# Patient Record
Sex: Female | Born: 2000 | Race: Black or African American | Hispanic: No | Marital: Single | Smoking: Never smoker
Health system: Southern US, Community
[De-identification: ages and names within clinical notes are randomized; demographics above are authoritative.]

## PROBLEM LIST (undated history)

## (undated) DIAGNOSIS — L309 Dermatitis, unspecified: Secondary | ICD-10-CM

## (undated) DIAGNOSIS — E669 Obesity, unspecified: Secondary | ICD-10-CM

## (undated) DIAGNOSIS — J302 Other seasonal allergic rhinitis: Secondary | ICD-10-CM

## (undated) DIAGNOSIS — Z87898 Personal history of other specified conditions: Secondary | ICD-10-CM

## (undated) HISTORY — DX: Dermatitis, unspecified: L30.9

## (undated) HISTORY — DX: Other seasonal allergic rhinitis: J30.2

## (undated) HISTORY — DX: Personal history of other specified conditions: Z87.898

## (undated) HISTORY — DX: Obesity, unspecified: E66.9

---
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
## 2020-01-21 IMAGING — US US ABDOMEN COMPLETE
1 series · 13 of 25 positions shown · non-contrast
Comparison: None.

CLINICAL DATA: Chronic abdominal pain

EXAM:
ABDOMEN ULTRASOUND COMPLETE

[Series 1: us abdomen complete · 0.22mm/px · 13 of 83 slices shown]
[im 1/83]
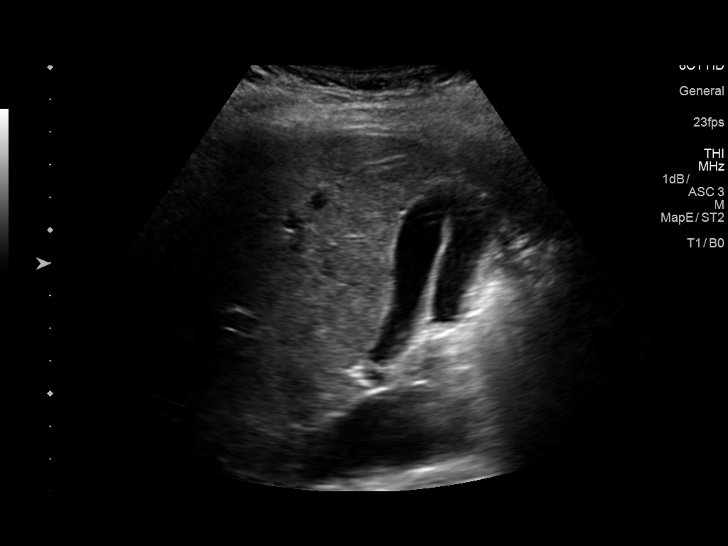
[im 7/83]
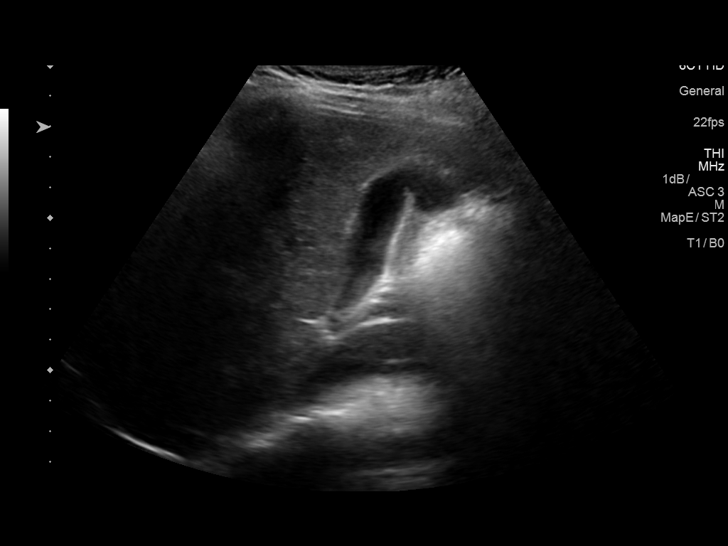
[im 14/83]
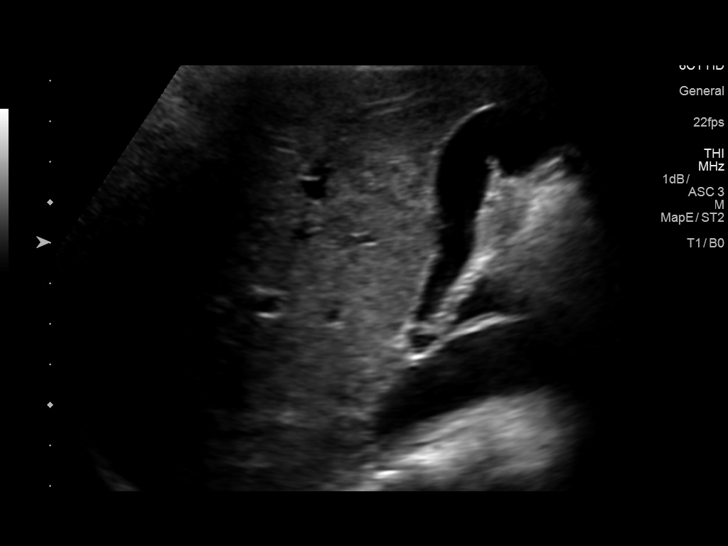
[im 21/83]
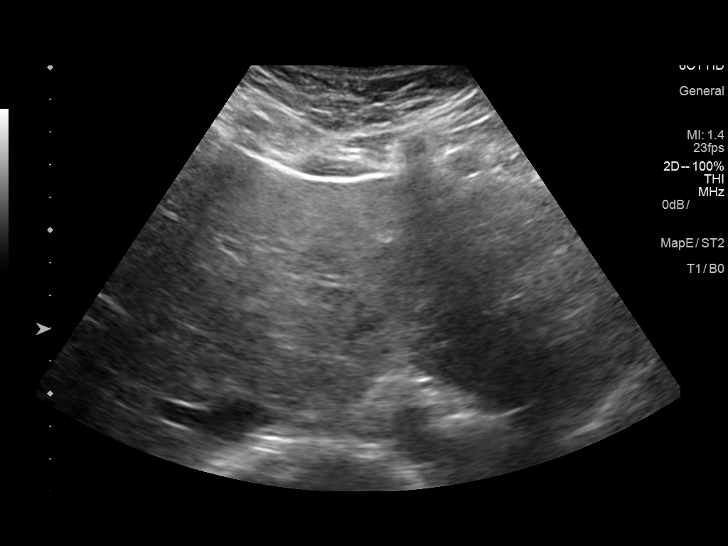
[im 28/83]
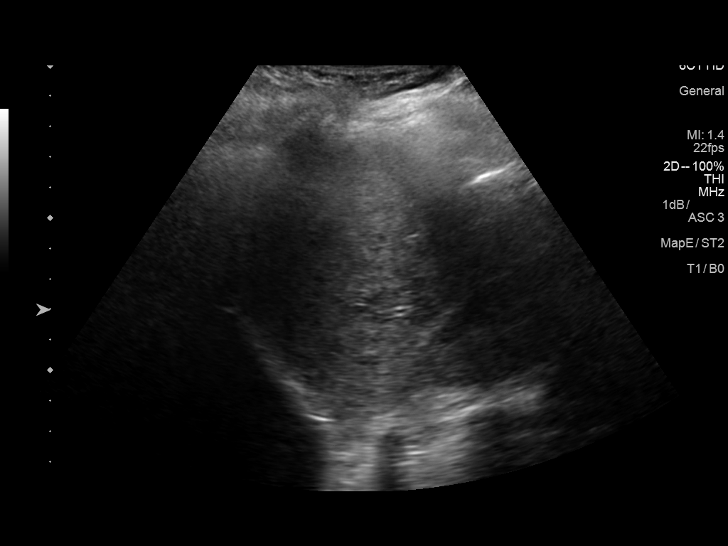
[im 35/83]
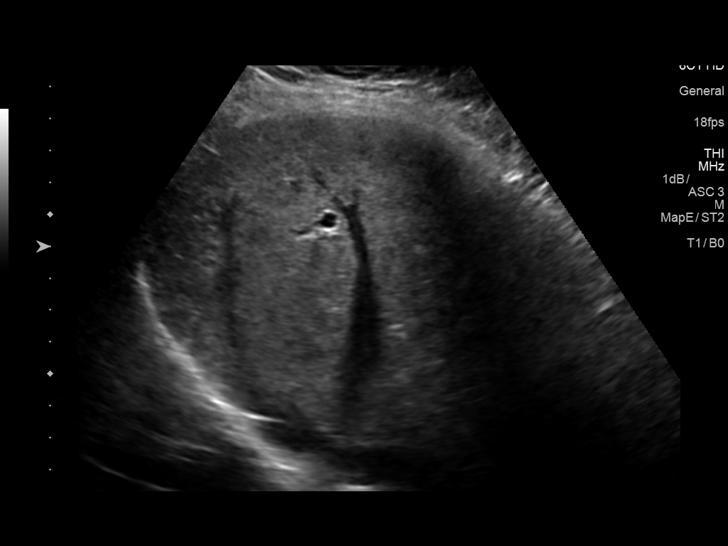
[im 42/83]
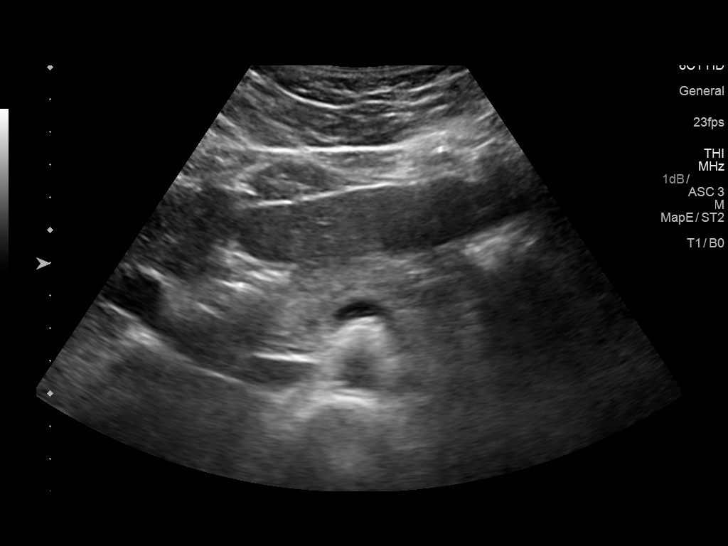
[im 48/83]
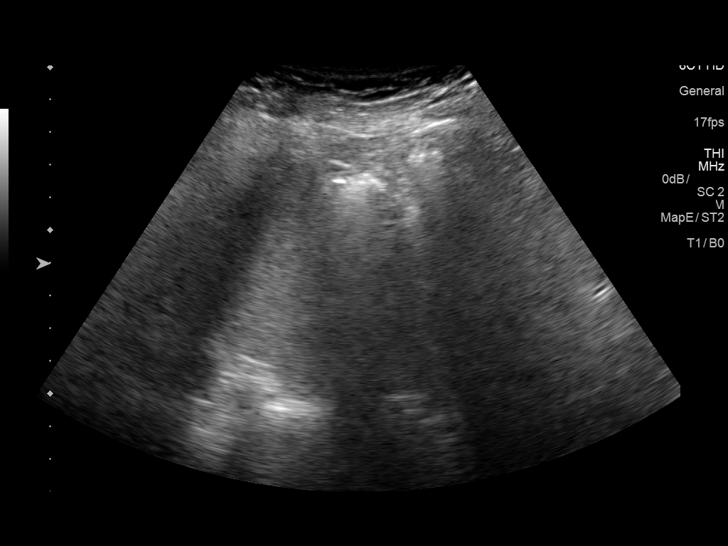
[im 55/83]
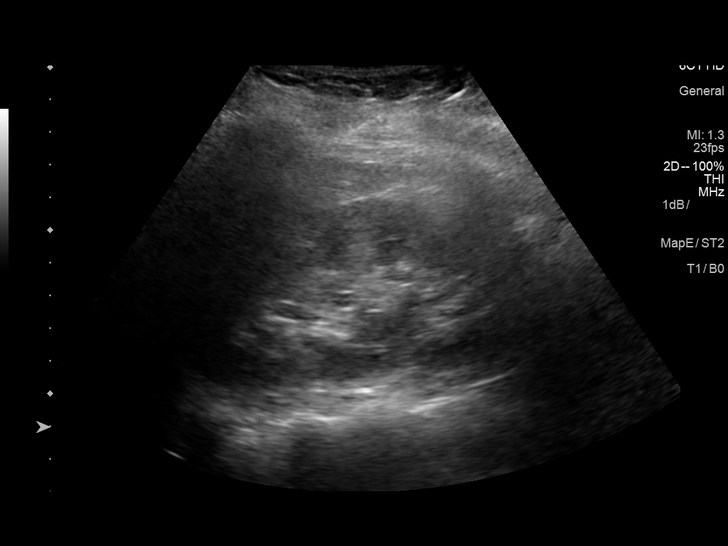
[im 62/83]
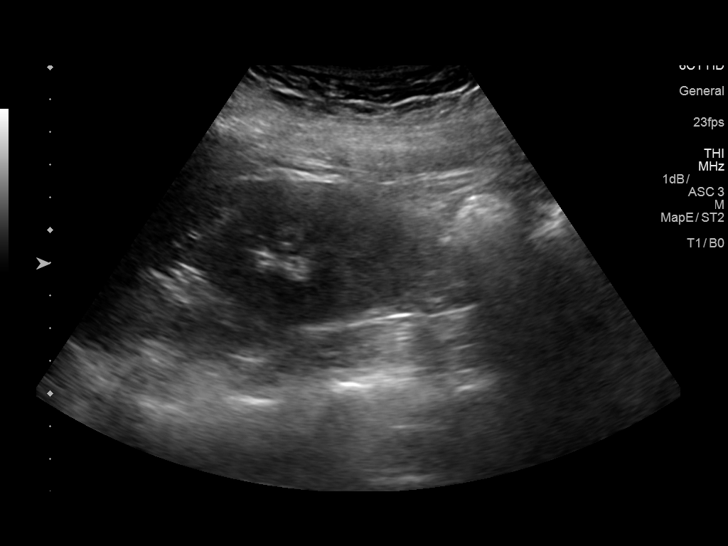
[im 69/83]
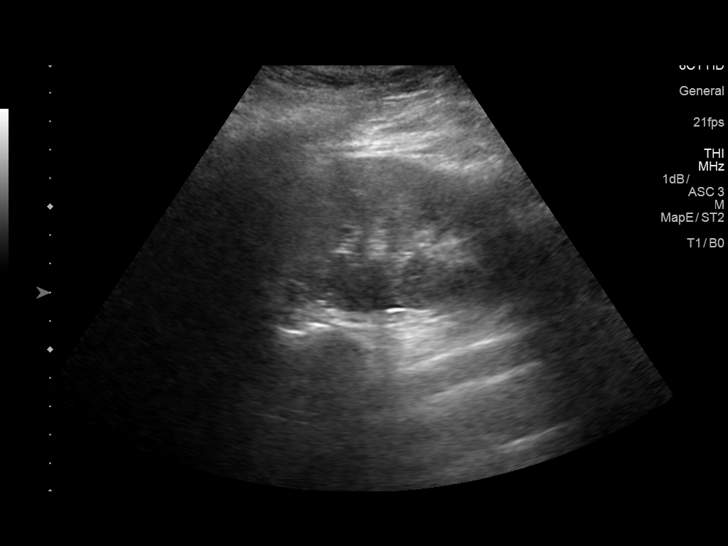
[im 76/83]
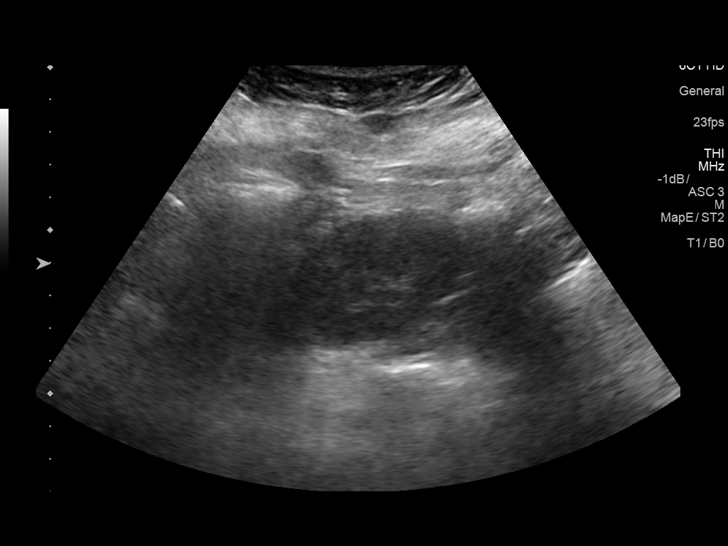
[im 83/83]
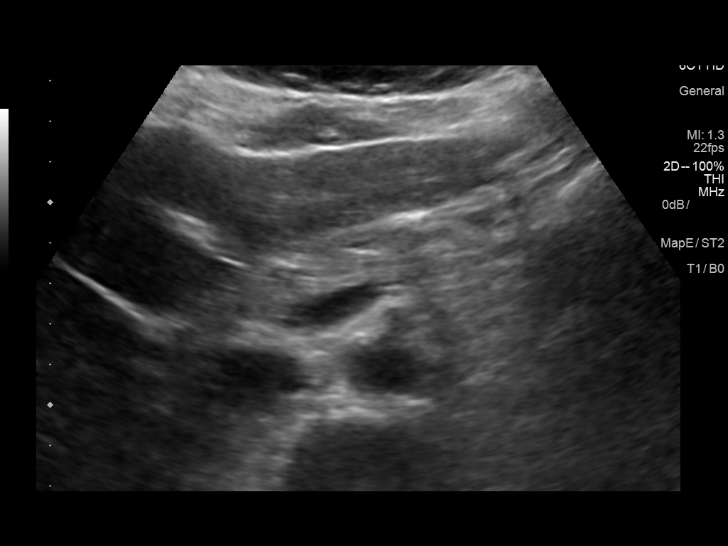

[13 of 25 positions shown; findings below may reference images not displayed]

FINDINGS: Gallbladder: No gallstones or wall thickening visualized. There is
no pericholecystic fluid. No sonographic Murphy sign noted by
sonographer.

Common bile duct: Diameter: 3 mm. No visualized intrahepatic, common
hepatic, or common bile duct dilatation.

Liver: No focal lesion identified. Liver echogenicity is mildly
inhomogeneous and overall somewhat increased. Portal vein is patent
on color Doppler imaging with normal direction of blood flow towards
the liver.

IVC: No abnormality visualized.

Pancreas: Visualized portion unremarkable. Portions of pancreas
obscured by gas.

Spleen: Size and appearance within normal limits.

Right Kidney: Length: 11.7 cm. Echogenicity within normal limits. No
mass or hydronephrosis visualized.

Left Kidney: Length: 11.1 cm. Echogenicity within normal limits. No
mass or hydronephrosis visualized.

Abdominal aorta: No aneurysm visualized.

Other findings: No appreciable ascites.
IMPRESSION: 1. The liver echogenicity is somewhat inhomogeneous and overall
increased. Suspect hepatic steatosis. While no focal liver lesions
are evident, it must be cautioned that the sensitivity of ultrasound
for detection of focal liver lesions is diminished in this
circumstance.

2. Portions of pancreas obscured by gas. Visualized portions of
pancreas appear normal.

3.  Study otherwise unremarkable.

## 2020-01-30 ENCOUNTER — Ambulatory Visit (HOSPITAL_COMMUNITY)
Admission: EM | Admit: 2020-01-30 | Discharge: 2020-01-30 | Disposition: A | Discharge status: Home / Self Care | Payer: BC Managed Care – PPO | Attending: Family Medicine | Admitting: Family Medicine

## 2020-01-30 ENCOUNTER — Other Ambulatory Visit: Payer: Self-pay

## 2020-01-30 ENCOUNTER — Encounter (HOSPITAL_COMMUNITY): Payer: Self-pay | Admitting: Family Medicine

## 2020-01-30 DIAGNOSIS — R1013 Epigastric pain: Secondary | ICD-10-CM | POA: Diagnosis not present

## 2020-01-30 MED ORDER — HYOSCYAMINE SULFATE SL 0.125 MG SL SUBL: 1.0000 | SUBLINGUAL_TABLET | Freq: Every evening | SUBLINGUAL | 1 refills | Status: DC | PRN

## 2020-01-30 NOTE — ED Triage Notes (Signed)
C/O sharp epigastric pains with nausea since this AM - has been having stomach trouble and recently had endoscopy.  States movement makes pain worse.  Took omeprazole.

## 2020-01-30 NOTE — ED Provider Notes (Signed)
[facility]    CSN: [id number] Arrival date & time: 01/30/20  1224      History   Chief Complaint Chief Complaint  Patient presents with  . Abdominal Pain    HPI Kimberly Henderson is a 19 y.o. female.   This is an established [facility] patient who presents with abdominal pain.  She was seen last October with a urinary tract infection.  She has had gastroenterology evaluation which included a negative endoscopy.  She is put on omeprazole anyway and this is not helped her.  Patient describes intermittent 9 out of 10 in severity pain in the epigastrium that occurs primarily in the morning, never awakens her at night, is not associated with nausea vomiting but is associated with some loose stools.  She is also taking hydroxyzine, and was told by her allergy specialist that this might cause memory loss.  Patient works at [employer].  She is recently started exercising more and is trying to stick to a diet.     Past Medical History:  Diagnosis Date  . Asthma   . Eczema    type rash  . History of angioedema   . Obesity   . Seasonal allergies    singular as needed; worse in cold weather  . Twin birth, mate liveborn     Patient Active Problem List   Diagnosis Date Noted  . Oligomenorrhea 09/04/2016  . Wears glasses 11/22/2015  . Prolonged periods 11/22/2015  . Well adolescent visit 07/29/2014  . Dysmenorrhea 07/29/2014  . Facial rash 01/05/2014  . BMI, pediatric > 99% for age 67/22/2014  . Prediabetes 01/31/2012  . Dyspepsia 01/31/2012  . Exercise-induced asthma 09/24/2011  . Acquired acanthosis nigricans 09/24/2011    History reviewed. No pertinent surgical history.  OB History   No obstetric history on file.      Home Medications    Prior to Admission medications   Medication Sig Start Date End Date Taking? Authorizing Provider  nortriptyline (PAMELOR) 25 MG capsule Take 1 capsule (25 mg total) by mouth at bedtime. 11/30/19 05/28/20 Yes  Salem Senate, MD  omeprazole (PRILOSEC) 40 MG capsule Take 1 capsule (40 mg total) by mouth daily. 11/30/19 01/30/20 Yes Salem Senate, MD  acetaminophen (TYLENOL) 325 MG tablet Take 2 tablets (650 mg total) by mouth every 6 (six) hours as needed for mild pain or moderate pain. 01/15/18   Sherrilee Gilles, NP  albuterol (PROVENTIL HFA;VENTOLIN HFA) 108 (90 BASE) MCG/ACT inhaler Inhale 2 puffs into the lungs every 6 (six) hours as needed for wheezing or shortness of breath (chest pain). 01/07/15   Panosh, Neta Mends, MD  diphenhydrAMINE (BENADRYL) 25 MG tablet Take 1 tablet (25 mg total) by mouth every 6 (six) hours as needed for itching or allergies (Rash). 12/31/13   Piepenbrink, Victorino Dike, PA-C  EPINEPHrine (EPIPEN 2-PAK) 0.3 mg/0.3 mL SOAJ injection Inject 0.3 mLs (0.3 mg total) into the muscle once as needed (for severe allergic reaction). CAll 911 immediately if you have to use this medicine 01/04/14   Panosh, Neta Mends, MD  Hyoscyamine Sulfate SL (LEVSIN/SL) 0.125 MG SUBL Place 1 tablet under the tongue at bedtime as needed and may repeat dose one time if needed. 01/30/20   Elvina Sidle, MD  levocetirizine (XYZAL) 5 MG tablet Take 5 mg by mouth at bedtime. 11/05/19   [provider]  Spacer/Aero-Holding Chambers (E-Z SPACER) inhaler Use as instructed 11/26/18   Wynn Banker, MD    Family  History Family History  Problem Relation Age of Onset  . Irritable bowel syndrome Mother   . Colitis Mother   . GER disease Mother   . Obesity Sister   . Hypertension Maternal Aunt   . Diabetes Maternal Grandmother   . Hypertension Maternal Grandmother   . Thyroid disease Maternal Grandmother   . Diabetes Paternal Grandmother   . Hypertension Paternal Grandmother     Social History Social History   Tobacco Use  . Smoking status: Never Smoker  . Smokeless tobacco: Never Used  Substance Use Topics  . Alcohol use: No  . Drug use: No     Allergies     Patient has no known allergies.   Review of Systems Review of Systems  Gastrointestinal: Positive for abdominal pain. Negative for vomiting.  All other systems reviewed and are negative.    Physical Exam Triage Vital Signs ED Triage Vitals  Enc Vitals Group     BP      Pulse      Resp      Temp      Temp src      SpO2      Weight      Height      Head Circumference      Peak Flow      Pain Score      Pain Loc      Pain Edu?      Excl. in GC?    No data found.  Updated Vital Signs BP (!) 109/53   Pulse (!) 114   Temp 99 F (37.2 C) (Oral)   Resp 16   LMP 01/28/2020 (Exact Date)   SpO2 98%    Physical Exam Vitals and nursing note reviewed.  Constitutional:      Appearance: She is well-developed. She is obese.  HENT:     Head: Normocephalic.  Eyes:     Extraocular Movements: Extraocular movements intact.  Cardiovascular:     Rate and Rhythm: Normal rate.  Pulmonary:     Effort: Pulmonary effort is normal.  Abdominal:     General: Abdomen is protuberant. Bowel sounds are normal.     Palpations: Abdomen is soft.     Tenderness: There is abdominal tenderness in the epigastric area.  Skin:    General: Skin is warm and dry.  Neurological:     Mental Status: She is alert.      UC Treatments / Results  Labs (all labs ordered are listed, but only abnormal results are displayed) Labs Reviewed - No data to display  EKG   Radiology No results found.  Procedures Procedures (including critical care time)  Medications Ordered in UC Medications - No data to display  Initial Impression / Assessment and Plan / UC Course  I have reviewed the triage vital signs and the nursing notes.  Pertinent labs & imaging results that were available during my care of the patient were reviewed by me and considered in my medical decision making (see chart for details).    Final Clinical Impressions(s) / UC Diagnoses   Final diagnoses:  Epigastric pain      Discharge Instructions     I believe your abdominal pain is coming from fatty liver.  The only real solution to this problem is losing weight.  In order to do this, you must stick to a diet that is lean and avoid starches.  Limit yourself to 1 starch per meal (potato, piece of bread, pasta, or  rice)  Continue your exercise because this will help stimulate a better metabolism and reduce hunger  Take healthy snacks and food to work: Fruits, vegetables, eggs, accelerated, chicken salad, tuna fish.    ED Prescriptions    Medication Sig Dispense Auth. Provider   Hyoscyamine Sulfate SL (LEVSIN/SL) 0.125 MG SUBL Place 1 tablet under the tongue at bedtime as needed and may repeat dose one time if needed. 30 tablet Robyn Haber, MD     I have reviewed the PDMP during this encounter.   Robyn Haber, MD 01/30/20 [phone]

## 2020-01-30 NOTE — Discharge Instructions (Addendum)
I believe your abdominal pain is coming from fatty liver.  The only real solution to this problem is losing weight.  In order to do this, you must stick to a diet that is lean and avoid starches.  Limit yourself to 1 starch per meal (potato, piece of bread, pasta, or rice)  Continue your exercise because this will help stimulate a better metabolism and reduce hunger  Take healthy snacks and food to work: Fruits, vegetables, eggs, egg salad, chicken salad, tuna fish.

## 2020-03-08 ENCOUNTER — Other Ambulatory Visit (INDEPENDENT_AMBULATORY_CARE_PROVIDER_SITE_OTHER): Payer: Self-pay | Admitting: Pediatric Gastroenterology

## 2020-05-24 DIAGNOSIS — F331 Major depressive disorder, recurrent, moderate: Secondary | ICD-10-CM | POA: Diagnosis not present

## 2020-06-15 DIAGNOSIS — F331 Major depressive disorder, recurrent, moderate: Secondary | ICD-10-CM | POA: Diagnosis not present

## 2020-06-23 DIAGNOSIS — F331 Major depressive disorder, recurrent, moderate: Secondary | ICD-10-CM | POA: Diagnosis not present

## 2020-06-28 DIAGNOSIS — F331 Major depressive disorder, recurrent, moderate: Secondary | ICD-10-CM | POA: Diagnosis not present

## 2020-07-07 ENCOUNTER — Telehealth: Payer: Self-pay | Admitting: Internal Medicine

## 2020-07-07 ENCOUNTER — Telehealth: Payer: BC Managed Care – PPO | Admitting: Internal Medicine

## 2020-07-07 NOTE — Progress Notes (Deleted)
Virtual Visit via Video Note  I connected with@ on 07/07/20 at 11:00 AM EDT by a video enabled telemedicine application and verified that I am speaking with the correct person using two identifiers. Location patient: home Location provider: home office Persons participating in the virtual visit: patient, provider  [place] national recommendations  regarding COVID 19 pandemic   video visit is advised over in office visit for this patient.  Patient aware  of the limitations of evaluation and management by telemedicine and  availability of in person appointments. and agreed to proceed.   HPI: Kimberly Henderson presents for video visit    ROS: See pertinent positives and negatives per HPI.  Past Medical History:  Diagnosis Date  . Asthma   . Eczema    type rash  . History of angioedema   . Obesity   . Seasonal allergies    singular as needed; worse in cold weather  . Twin birth, mate liveborn     No past surgical history on file.  Family History  Problem Relation Age of Onset  . Irritable bowel syndrome Mother   . Colitis Mother   . GER disease Mother   . Obesity Sister   . Hypertension Maternal Aunt   . Diabetes Maternal Grandmother   . Hypertension Maternal Grandmother   . Thyroid disease Maternal Grandmother   . Diabetes Paternal Grandmother   . Hypertension Paternal Grandmother     Social History   Tobacco Use  . Smoking status: Never Smoker  . Smokeless tobacco: Never Used  Vaping Use  . Vaping Use: Never used  Substance Use Topics  . Alcohol use: No  . Drug use: No      Current Outpatient Medications:  .  acetaminophen (TYLENOL) 325 MG tablet, Take 2 tablets (650 mg total) by mouth every 6 (six) hours as needed for mild pain or moderate pain., Disp: 30 tablet, Rfl: 0 .  albuterol (PROVENTIL HFA;VENTOLIN HFA) 108 (90 BASE) MCG/ACT inhaler, Inhale 2 puffs into the lungs every 6 (six) hours as needed for wheezing or shortness of breath (chest pain)., Disp: 1  Inhaler, Rfl: 1 .  diphenhydrAMINE (BENADRYL) 25 MG tablet, Take 1 tablet (25 mg total) by mouth every 6 (six) hours as needed for itching or allergies (Rash)., Disp: 30 tablet, Rfl: 0 .  EPINEPHrine (EPIPEN 2-PAK) 0.3 mg/0.3 mL SOAJ injection, Inject 0.3 mLs (0.3 mg total) into the muscle once as needed (for severe allergic reaction). CAll 911 immediately if you have to use this medicine, Disp: 1 Device, Rfl: 0 .  Hyoscyamine Sulfate SL (LEVSIN/SL) 0.125 MG SUBL, Place 1 tablet under the tongue at bedtime as needed and may repeat dose one time if needed., Disp: 30 tablet, Rfl: 1 .  levocetirizine (XYZAL) 5 MG tablet, Take 5 mg by mouth at bedtime., Disp: , Rfl:  .  nortriptyline (PAMELOR) 25 MG capsule, TAKE 1 CAPSULE BY MOUTH AT BEDTIME, Disp: 90 capsule, Rfl: 1 .  Spacer/Aero-Holding Chambers (E-Z SPACER) inhaler, Use as instructed, Disp: 1 each, Rfl: 0  EXAM: BP Readings from Last 3 Encounters:  01/30/20 (!) 109/53  11/30/19 110/70  08/31/19 103/63    VITALS per patient if applicable:  GENERAL: alert, oriented, appears well and in no acute distress  HEENT: atraumatic, conjunttiva clear, no obvious abnormalities on inspection of external nose and ears  NECK: normal movements of the head and neck  LUNGS: on inspection no signs of respiratory distress, breathing rate appears normal, no obvious gross SOB,  gasping or wheezing  CV: no obvious cyanosis  MS: moves all visible extremities without noticeable abnormality  PSYCH/NEURO: pleasant and cooperative, no obvious depression or anxiety, speech and thought processing grossly intact Lab Results  Component Value Date   WBC 5.4 12/02/2019   HGB 12.0 12/02/2019   HCT 34.4 12/02/2019   PLT 292 12/02/2019   GLUCOSE 94 04/28/2018   CHOL 157 12/06/2014   TRIG 63 12/06/2014   HDL 53 12/06/2014   LDLCALC 91 12/06/2014   ALT 7 04/28/2018   AST 13 04/28/2018   NA 139 04/28/2018   K 4.0 04/28/2018   CL 104 04/28/2018   CREATININE 0.56  04/28/2018   BUN 6 (L) 04/28/2018   CO2 26 04/28/2018   TSH 2.00 03/08/2017   HGBA1C 5.5 11/04/2018    ASSESSMENT AND PLAN:  Discussed the following assessment and plan:  No diagnosis found.  Counseled.   Expectant management and discussion of plan and treatment with opportunity to ask questions and all were answered. The patient agreed with the plan and demonstrated an understanding of the instructions.   Advised to call back or seek an in-person evaluation if worsening  or having  further concerns . No follow-ups on file. I provided *** minutes of non-face-to-face time during this encounter.   Berniece Andreas, MD

## 2020-07-07 NOTE — Telephone Encounter (Signed)
Patient called back and I let her know when her last TB was done and also advised for her to have them fax the forms for her and call back to schedule an in office appointment. Patient verbalized an understanding.

## 2020-07-07 NOTE — Telephone Encounter (Signed)
Pt is wondering if she has had a TB skin test within the past year?  Pt can be reached at [phone]

## 2020-07-11 ENCOUNTER — Telehealth: Payer: BC Managed Care – PPO | Admitting: Internal Medicine

## 2020-07-11 IMAGING — DX DG FINGER THUMB 2+V*R*
3 series · 3 of 3 positions shown · non-contrast
Comparison: None.

CLINICAL DATA: Thumb pain, posttraumatic. Pain is at the base of
the first metacarpal.

EXAM:
RIGHT THUMB 2+V

[finger ap]
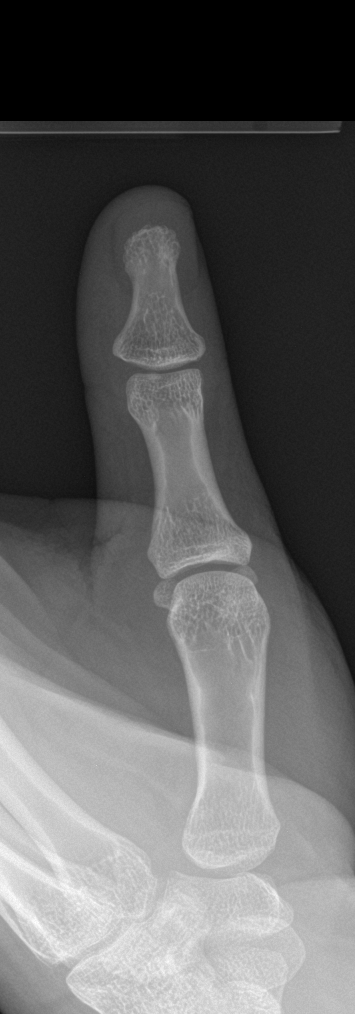

[finger obl]
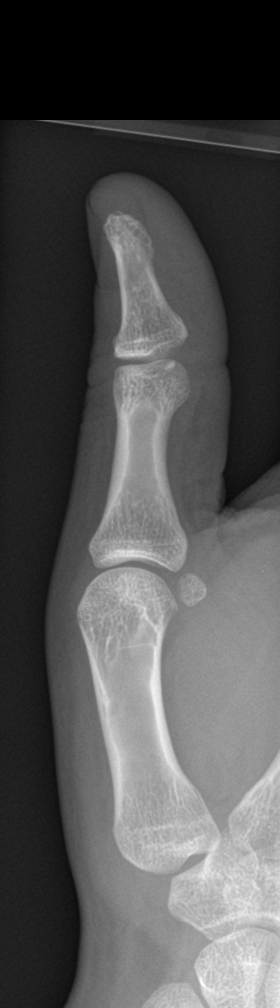

[finger lat]
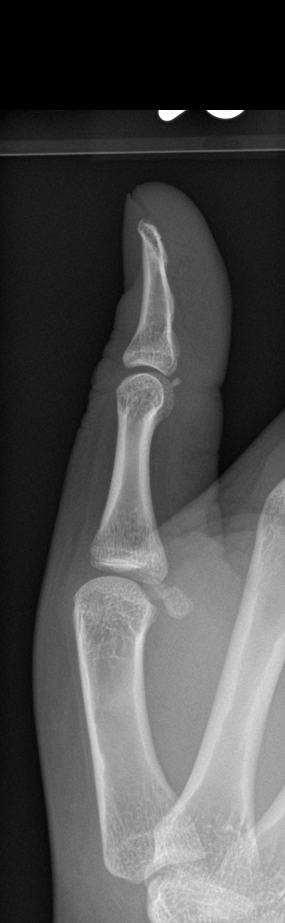

[3 of 3 positions shown; findings below may reference images not displayed]

FINDINGS: No fracture or dislocation. Sesamoid bones are present. No acute
soft tissue finding.
IMPRESSION: Negative for fracture.

## 2020-07-19 DIAGNOSIS — F331 Major depressive disorder, recurrent, moderate: Secondary | ICD-10-CM | POA: Diagnosis not present

## 2020-07-29 DIAGNOSIS — Z6838 Body mass index (BMI) 38.0-38.9, adult: Secondary | ICD-10-CM | POA: Diagnosis not present

## 2020-07-29 DIAGNOSIS — Z113 Encounter for screening for infections with a predominantly sexual mode of transmission: Secondary | ICD-10-CM | POA: Diagnosis not present

## 2020-07-29 DIAGNOSIS — Z01419 Encounter for gynecological examination (general) (routine) without abnormal findings: Secondary | ICD-10-CM | POA: Diagnosis not present

## 2020-08-01 LAB — HM PAP SMEAR

## 2020-08-23 ENCOUNTER — Telehealth (HOSPITAL_COMMUNITY): Payer: Self-pay

## 2020-08-23 ENCOUNTER — Ambulatory Visit (HOSPITAL_COMMUNITY): Payer: Self-pay

## 2020-08-23 DIAGNOSIS — F331 Major depressive disorder, recurrent, moderate: Secondary | ICD-10-CM | POA: Diagnosis not present

## 2020-08-23 NOTE — Telephone Encounter (Signed)
Attempted to call pt regarding his appt that he made for physical and TB skin test, that we do not offer those services. Left message for pt to call us back, with no other information provided on voicemail.

## 2020-08-25 ENCOUNTER — Telehealth: Payer: Self-pay

## 2020-08-25 NOTE — Telephone Encounter (Signed)
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 Call pt [phone] once complete.  Pt states deadline is 08/29/20.

## 2020-09-01 DIAGNOSIS — F331 Major depressive disorder, recurrent, moderate: Secondary | ICD-10-CM | POA: Diagnosis not present

## 2020-09-01 NOTE — Telephone Encounter (Signed)
So unfortunately I havent  seen her in over a year    Last visit was 5 2020 And thus cannot complete and  Sign  a health assessment .   I can work her in  For an in person visit .   When she  can come  .  I can see her tomorrow afternoon  If she can come then   We can add on 330 if needed     In the future please do not accept the form for completion  Without  appt for visit   If not had a visit or health assessment in the past year

## 2020-09-02 NOTE — Telephone Encounter (Signed)
Unable to leave a message due to voicemail not being set up yet. 

## 2020-09-02 NOTE — Telephone Encounter (Signed)
Pt came by office. Offered appt Monday 09/05/20 but Pt states she does not want to schedule an appt at this time.

## 2020-09-03 ENCOUNTER — Ambulatory Visit (HOSPITAL_COMMUNITY): Payer: Self-pay

## 2020-09-06 ENCOUNTER — Ambulatory Visit (HOSPITAL_COMMUNITY): Payer: Self-pay

## 2020-09-15 ENCOUNTER — Other Ambulatory Visit: Payer: Self-pay

## 2020-09-15 ENCOUNTER — Ambulatory Visit (HOSPITAL_COMMUNITY)
Admission: EM | Admit: 2020-09-15 | Discharge: 2020-09-15 | Disposition: A | Discharge status: Home / Self Care | Payer: BC Managed Care – PPO | Attending: Family Medicine | Admitting: Family Medicine

## 2020-09-15 ENCOUNTER — Encounter (HOSPITAL_COMMUNITY): Payer: Self-pay | Admitting: Emergency Medicine

## 2020-09-15 DIAGNOSIS — R6883 Chills (without fever): Secondary | ICD-10-CM

## 2020-09-15 DIAGNOSIS — Z20822 Contact with and (suspected) exposure to covid-19: Secondary | ICD-10-CM | POA: Diagnosis not present

## 2020-09-15 DIAGNOSIS — R197 Diarrhea, unspecified: Secondary | ICD-10-CM | POA: Diagnosis not present

## 2020-09-15 DIAGNOSIS — R1030 Lower abdominal pain, unspecified: Secondary | ICD-10-CM | POA: Insufficient documentation

## 2020-09-15 DIAGNOSIS — Z3202 Encounter for pregnancy test, result negative: Secondary | ICD-10-CM

## 2020-09-15 LAB — CBC WITH DIFFERENTIAL/PLATELET
Abs Immature Granulocytes: 0.01 10*3/uL (ref 0.00–0.07)
Basophils Absolute: 0 10*3/uL (ref 0.0–0.1)
Basophils Relative: 0 %
Eosinophils Absolute: 0 10*3/uL (ref 0.0–0.5)
Eosinophils Relative: 0 %
HCT: 34.9 % — ABNORMAL LOW (ref 36.0–46.0)
Hemoglobin: 11.9 g/dL — ABNORMAL LOW (ref 12.0–15.0)
Immature Granulocytes: 0 %
Lymphocytes Relative: 21 %
Lymphs Abs: 1.1 10*3/uL (ref 0.7–4.0)
MCH: 33.2 pg (ref 26.0–34.0)
MCHC: 34.1 g/dL (ref 30.0–36.0)
MCV: 97.5 fL (ref 80.0–100.0)
Monocytes Absolute: 0.2 10*3/uL (ref 0.1–1.0)
Monocytes Relative: 4 %
Neutro Abs: 4 10*3/uL (ref 1.7–7.7)
Neutrophils Relative %: 75 %
Platelets: 347 10*3/uL (ref 150–400)
RBC: 3.58 MIL/uL — ABNORMAL LOW (ref 3.87–5.11)
RDW: 13.2 % (ref 11.5–15.5)
WBC: 5.3 10*3/uL (ref 4.0–10.5)
nRBC: 0 % (ref 0.0–0.2)

## 2020-09-15 LAB — POCT URINALYSIS DIPSTICK, ED / UC
Glucose, UA: NEGATIVE mg/dL
Ketones, ur: >=160 mg/dL — AB
Nitrite: NEGATIVE
Protein, ur: 100 mg/dL — AB
Specific Gravity, Urine: 1.025 (ref 1.005–1.030)
Urobilinogen, UA: 1 mg/dL (ref 0.0–1.0)
pH: 7 (ref 5.0–8.0)

## 2020-09-15 LAB — RESP PANEL BY RT PCR (RSV, FLU A&B, COVID)
Influenza A by PCR: NEGATIVE
Influenza B by PCR: NEGATIVE
Respiratory Syncytial Virus by PCR: NEGATIVE
SARS Coronavirus 2 by RT PCR: NEGATIVE

## 2020-09-15 LAB — POC URINE PREG, ED: Preg Test, Ur: NEGATIVE

## 2020-09-15 LAB — BASIC METABOLIC PANEL
Anion gap: 11 (ref 5–15)
BUN: 6 mg/dL (ref 6–20)
CO2: 26 mmol/L (ref 22–32)
Calcium: 9 mg/dL (ref 8.9–10.3)
Chloride: 104 mmol/L (ref 98–111)
Creatinine, Ser: 0.58 mg/dL (ref 0.44–1.00)
GFR, Estimated: >60 mL/min (ref >60)
Glucose, Bld: 86 mg/dL (ref 70–99)
Potassium: 3.2 mmol/L — ABNORMAL LOW (ref 3.5–5.1)
Sodium: 141 mmol/L (ref 135–145)

## 2020-09-15 LAB — LIPASE, BLOOD: Lipase: 23 U/L (ref 11–51)

## 2020-09-15 MED ORDER — LOPERAMIDE HCL 2 MG PO CAPS: 2.0000 mg | ORAL_CAPSULE | Freq: Four times a day (QID) | ORAL | 0 refills | Status: DC | PRN

## 2020-09-15 MED ORDER — ONDANSETRON 4 MG PO TBDP: 4.0000 mg | ORAL_TABLET | Freq: Three times a day (TID) | ORAL | 0 refills | Status: DC | PRN

## 2020-09-15 NOTE — ED Triage Notes (Addendum)
Pt c/o mid abdominal pain, diarrhea and chills x 3 days. Pt states the diarrhea is worse first thing in the morning then subsides. She has some nausea but no vomiting. Pt states she would like to be checked for dehydrated because her hands feel shaky.

## 2020-09-15 NOTE — ED Provider Notes (Signed)
[facility]    CSN: [id number] Arrival date & time: 09/15/20  1020      History   Chief Complaint Chief Complaint  Patient presents with  . Abdominal Pain  . Diarrhea  . Chills    HPI Kimberly Henderson is a 19 y.o. female.   Patient presenting today with 3 day hx of mid abdominal pain, chills, diarrhea, intermittent nausea. Denies vomiting, fever, sweats, headache, sore throat, dizziness, syncope. Has not been eating or drinking much but has been able to tolerate PO. Taking tylenol without much benefit. Started recently at a daycare, lots of sick contacts. Long hx of abdominal pain issues, has undergone workup without obvious cause at this time. States this feels different.      Past Medical History:  Diagnosis Date  . Asthma   . Eczema    type rash  . History of angioedema   . Obesity   . Seasonal allergies    singular as needed; worse in cold weather  . Twin birth, mate liveborn     Patient Active Problem List   Diagnosis Date Noted  . Oligomenorrhea 09/04/2016  . Wears glasses 11/22/2015  . Prolonged periods 11/22/2015  . Well adolescent visit 07/29/2014  . Dysmenorrhea 07/29/2014  . Facial rash 01/05/2014  . BMI, pediatric > 99% for age 61/22/2014  . Prediabetes 01/31/2012  . Dyspepsia 01/31/2012  . Exercise-induced asthma 09/24/2011  . Acquired acanthosis nigricans 09/24/2011    History reviewed. No pertinent surgical history.  OB History   No obstetric history on file.      Home Medications    Prior to Admission medications   Medication Sig Start Date End Date Taking? Authorizing Provider  acetaminophen (TYLENOL) 325 MG tablet Take 2 tablets (650 mg total) by mouth every 6 (six) hours as needed for mild pain or moderate pain. 01/15/18  Yes Scoville, Nadara Mustard, NP  albuterol (PROVENTIL HFA;VENTOLIN HFA) 108 (90 BASE) MCG/ACT inhaler Inhale 2 puffs into the lungs every 6 (six) hours as needed for wheezing or shortness of breath (chest  pain). 01/07/15  Yes Panosh, Neta Mends, MD  diphenhydrAMINE (BENADRYL) 25 MG tablet Take 1 tablet (25 mg total) by mouth every 6 (six) hours as needed for itching or allergies (Rash). 12/31/13  Yes Piepenbrink, Victorino Dike, PA-C  EPINEPHrine (EPIPEN 2-PAK) 0.3 mg/0.3 mL SOAJ injection Inject 0.3 mLs (0.3 mg total) into the muscle once as needed (for severe allergic reaction). CAll 911 immediately if you have to use this medicine 01/04/14  Yes Panosh, Neta Mends, MD  Hyoscyamine Sulfate SL (LEVSIN/SL) 0.125 MG SUBL Place 1 tablet under the tongue at bedtime as needed and may repeat dose one time if needed. 01/30/20  Yes Elvina Sidle, MD  levocetirizine (XYZAL) 5 MG tablet Take 5 mg by mouth at bedtime. 11/05/19  Yes [provider]  omeprazole (PRILOSEC) 40 MG capsule Take 40 mg by mouth daily.   Yes [provider]  Spacer/Aero-Holding Chambers (E-Z SPACER) inhaler Use as instructed 11/26/18  Yes Koberlein, Paris Lore, MD  loperamide (IMODIUM) 2 MG capsule Take 1 capsule (2 mg total) by mouth 4 (four) times daily as needed for diarrhea or loose stools. 09/15/20   Particia Nearing, PA-C  nortriptyline (PAMELOR) 25 MG capsule TAKE 1 CAPSULE BY MOUTH AT BEDTIME 03/08/20   Salem Senate, MD  ondansetron [facility] ODT) 4 MG disintegrating tablet Take 1 tablet (4 mg total) by mouth every 8 (eight) hours as needed for nausea or vomiting.  09/15/20   Particia Nearing, PA-C    Family History Family History  Problem Relation Age of Onset  . Irritable bowel syndrome Mother   . Colitis Mother   . GER disease Mother   . Obesity Sister   . Hypertension Maternal Aunt   . Diabetes Maternal Grandmother   . Hypertension Maternal Grandmother   . Thyroid disease Maternal Grandmother   . Diabetes Paternal Grandmother   . Hypertension Paternal Grandmother     Social History Social History   Tobacco Use  . Smoking status: Never Smoker  . Smokeless tobacco: Never Used  Vaping  Use  . Vaping Use: Never used  Substance Use Topics  . Alcohol use: No  . Drug use: No     Allergies   Patient has no known allergies.   Review of Systems Review of Systems PER HPI    Physical Exam Triage Vital Signs ED Triage Vitals  Enc Vitals Group     BP 09/15/20 1147 119/64     Pulse Rate 09/15/20 1147 85     Resp --      Temp 09/15/20 1147 98.7 F (37.1 C)     Temp Source 09/15/20 1147 Oral     SpO2 09/15/20 1147 100 %     Weight --      Height --      Head Circumference --      Peak Flow --      Pain Score 09/15/20 1150 8     Pain Loc --      Pain Edu? --      Excl. in GC? --    No data found.  Updated Vital Signs BP 119/64 (BP Location: Right Arm)   Pulse 85   Temp 98.7 F (37.1 C) (Oral)   LMP 09/10/2020   SpO2 100%   Visual Acuity Right Eye Distance:   Left Eye Distance:   Bilateral Distance:    Right Eye Near:   Left Eye Near:    Bilateral Near:     Physical Exam Vitals and nursing note reviewed.  Constitutional:      Appearance: Normal appearance. She is not ill-appearing.  HENT:     Head: Atraumatic.     Right Ear: Tympanic membrane normal.     Left Ear: Tympanic membrane normal.     Nose: Nose normal.     Mouth/Throat:     Mouth: Mucous membranes are moist.     Pharynx: Oropharynx is clear.  Eyes:     Extraocular Movements: Extraocular movements intact.     Conjunctiva/sclera: Conjunctivae normal.  Cardiovascular:     Rate and Rhythm: Normal rate and regular rhythm.     Heart sounds: Normal heart sounds.  Pulmonary:     Effort: Pulmonary effort is normal.     Breath sounds: Normal breath sounds.  Abdominal:     General: Bowel sounds are normal. There is no distension.     Palpations: Abdomen is soft.     Tenderness: There is abdominal tenderness (mild periumbilical ttp). There is no right CVA tenderness, left CVA tenderness, guarding or rebound.  Musculoskeletal:        General: Normal range of motion.     Cervical  back: Normal range of motion and neck supple.  Skin:    General: Skin is warm and dry.  Neurological:     Mental Status: She is alert and oriented to person, place, and time.  Psychiatric:  Mood and Affect: Mood normal.        Thought Content: Thought content normal.        Judgment: Judgment normal.     UC Treatments / Results  Labs (all labs ordered are listed, but only abnormal results are displayed) Labs Reviewed  POCT URINALYSIS DIPSTICK, ED / UC - Abnormal; Notable for the following components:      Result Value   Bilirubin Urine MODERATE (*)    Ketones, ur >=160 (*)    Hgb urine dipstick TRACE (*)    Protein, ur 100 (*)    Leukocytes,Ua TRACE (*)    All other components within normal limits  RESP PANEL BY RT PCR (RSV, FLU A&B, COVID)  CBC WITH DIFFERENTIAL/PLATELET  BASIC METABOLIC PANEL  LIPASE, BLOOD  POC URINE PREG, ED    EKG   Radiology No results found.  Procedures Procedures (including critical care time)  Medications Ordered in UC Medications - No data to display  Initial Impression / Assessment and Plan / UC Course  I have reviewed the triage vital signs and the nursing notes.  Pertinent labs & imaging results that were available during my care of the patient were reviewed by me and considered in my medical decision making (see chart for details).     U/A showing dehydration and pt admits to not drinking much the last few days. Urine preg negative, labs and resp panel pending. Discussed importance of good hydration to keep up with her diarrhea and will start imodium and zofran prn to help additionally. Go to ER if becoming weak, dizzy, confused, or unable to tolerate fluids or if abdominal pain becomes significantly worse.   Final Clinical Impressions(s) / UC Diagnoses   Final diagnoses:  Lower abdominal pain  Diarrhea, unspecified type   Discharge Instructions   None    ED Prescriptions    Medication Sig Dispense Auth. Provider    loperamide (IMODIUM) 2 MG capsule Take 1 capsule (2 mg total) by mouth 4 (four) times daily as needed for diarrhea or loose stools. 20 capsule Particia Nearing, PA-C   ondansetron (ZOFRAN ODT) 4 MG disintegrating tablet Take 1 tablet (4 mg total) by mouth every 8 (eight) hours as needed for nausea or vomiting. 20 tablet Particia Nearing, [place]     PDMP not reviewed this encounter.   Particia Nearing, [place] 09/15/20 1332

## 2020-09-21 ENCOUNTER — Other Ambulatory Visit: Payer: Self-pay

## 2020-09-21 ENCOUNTER — Encounter: Payer: Self-pay | Admitting: Adult Health

## 2020-09-21 ENCOUNTER — Ambulatory Visit (INDEPENDENT_AMBULATORY_CARE_PROVIDER_SITE_OTHER): Payer: BC Managed Care – PPO | Admitting: Adult Health

## 2020-09-21 VITALS — BP 112/72 | HR 74 | Temp 98.1°F | Wt 207.0 lb

## 2020-09-21 DIAGNOSIS — R1084 Generalized abdominal pain: Secondary | ICD-10-CM

## 2020-09-21 DIAGNOSIS — R197 Diarrhea, unspecified: Secondary | ICD-10-CM

## 2020-09-21 DIAGNOSIS — R110 Nausea: Secondary | ICD-10-CM | POA: Diagnosis not present

## 2020-09-21 MED ORDER — DICYCLOMINE HCL 10 MG PO CAPS: 10.0000 mg | ORAL_CAPSULE | Freq: Three times a day (TID) | ORAL | 1 refills | Status: DC

## 2020-09-21 NOTE — Patient Instructions (Signed)
It was nice meeting you today   I am sorry you are not feeling well.   I am going to try you on a medication called Bentyl   Please follow up with GI and PCP if needed

## 2020-09-21 NOTE — Progress Notes (Signed)
Subjective:    Patient ID: Kimberly Henderson, female    DOB: 2001/02/14, 19 y.o.   MRN: [id number]  HPI 19 year old female who  has a past medical history of Asthma, Eczema, History of angioedema, Obesity, Seasonal allergies, and Twin birth, mate liveborn.  She is a patient of Dr. Fabian Sharp who I am seeing today for chronic GI issues.  She was seen approximately 1 week ago at [facility] urgent care where she presented with 3-day history of mid abdominal pain, chills, diarrhea, intermittent nausea.  She has a long history of GI issues and has gone extensive work-up without clear-cut cause.  Her labs including urine preg, CBC, CMP, respiratory panel were negative.  She did have a slightly elevated CRP and her urinalysis showed dehydration  She was given Zofran and Imodium.  Today she reports that her symptoms have not improved much, she continues to have intermittent episodes of nausea as well as chronic diarrhea that seems to be worse in the morning.  She also continues to have abdominal pain that is described as sharp cramping.  She has not noticed any blood in her stool.  She has not had any fevers or vomiting since being discharged from the hospital  Review of Systems See HPI   Past Medical History:  Diagnosis Date  . Asthma   . Eczema    type rash  . History of angioedema   . Obesity   . Seasonal allergies    singular as needed; worse in cold weather  . Twin birth, mate liveborn     Social History   Socioeconomic History  . Marital status: Single    Spouse name: Not on file  . Number of children: Not on file  . Years of education: Not on file  . Highest education level: Not on file  Occupational History  . Not on file  Tobacco Use  . Smoking status: Never Smoker  . Smokeless tobacco: Never Used  Vaping Use  . Vaping Use: Never used  Substance and Sexual Activity  . Alcohol use: No  . Drug use: No  . Sexual activity: Not Currently  Other Topics Concern  . Not on file    Social History Narrative   Attends 12 th grade at [employer]         Social Determinants of Health   Financial Resource Strain:   . Difficulty of Paying Living Expenses: Not on file  Food Insecurity:   . Worried About Programme researcher, broadcasting/film/video in the Last Year: Not on file  . Ran Out of Food in the Last Year: Not on file  Transportation Needs:   . Lack of Transportation (Medical): Not on file  . Lack of Transportation (Non-Medical): Not on file  Physical Activity:   . Days of Exercise per Week: Not on file  . Minutes of Exercise per Session: Not on file  Stress:   . Feeling of Stress : Not on file  Social Connections:   . Frequency of Communication with Friends and Family: Not on file  . Frequency of Social Gatherings with Friends and Family: Not on file  . Attends Religious Services: Not on file  . Active Member of Clubs or Organizations: Not on file  . Attends Banker Meetings: Not on file  . Marital Status: Not on file  Intimate Partner Violence:   . Fear of Current or Ex-Partner: Not on file  . Emotionally Abused: Not on file  .  Physically Abused: Not on file  . Sexually Abused: Not on file    History reviewed. No pertinent surgical history.  Family History  Problem Relation Age of Onset  . Irritable bowel syndrome Mother   . Colitis Mother   . GER disease Mother   . Obesity Sister   . Hypertension Maternal Aunt   . Diabetes Maternal Grandmother   . Hypertension Maternal Grandmother   . Thyroid disease Maternal Grandmother   . Diabetes Paternal Grandmother   . Hypertension Paternal Grandmother     No Known Allergies  Current Outpatient Medications on File Prior to Visit  Medication Sig Dispense Refill  . acetaminophen (TYLENOL) 325 MG tablet Take 2 tablets (650 mg total) by mouth every 6 (six) hours as needed for mild pain or moderate pain. 30 tablet 0  . albuterol (PROVENTIL HFA;VENTOLIN HFA) 108 (90 BASE) MCG/ACT inhaler Inhale 2 puffs  into the lungs every 6 (six) hours as needed for wheezing or shortness of breath (chest pain). 1 Inhaler 1  . diphenhydrAMINE (BENADRYL) 25 MG tablet Take 1 tablet (25 mg total) by mouth every 6 (six) hours as needed for itching or allergies (Rash). 30 tablet 0  . EPINEPHrine (EPIPEN 2-PAK) 0.3 mg/0.3 mL SOAJ injection Inject 0.3 mLs (0.3 mg total) into the muscle once as needed (for severe allergic reaction). CAll 911 immediately if you have to use this medicine 1 Device 0  . Hyoscyamine Sulfate SL (LEVSIN/SL) 0.125 MG SUBL Place 1 tablet under the tongue at bedtime as needed and may repeat dose one time if needed. 30 tablet 1  . nortriptyline (PAMELOR) 25 MG capsule TAKE 1 CAPSULE BY MOUTH AT BEDTIME 90 capsule 1  . omeprazole (PRILOSEC) 40 MG capsule Take 40 mg by mouth daily.    . ondansetron (ZOFRAN ODT) 4 MG disintegrating tablet Take 1 tablet (4 mg total) by mouth every 8 (eight) hours as needed for nausea or vomiting. 20 tablet 0  . Spacer/Aero-Holding Chambers (E-Z SPACER) inhaler Use as instructed 1 each 0  . levocetirizine (XYZAL) 5 MG tablet Take 5 mg by mouth at bedtime. (Patient not taking: Reported on 09/21/2020)    . loperamide (IMODIUM) 2 MG capsule Take 1 capsule (2 mg total) by mouth 4 (four) times daily as needed for diarrhea or loose stools. (Patient not taking: Reported on 09/21/2020) 20 capsule 0   No current facility-administered medications on file prior to visit.    BP 112/72 (BP Location: Right Arm, Patient Position: Sitting, Cuff Size: Large)   Pulse 74   Temp 98.1 F (36.7 C) (Oral)   Wt 207 lb (93.9 kg)   LMP 09/10/2020   SpO2 98%   BMI 38.19 kg/m       Objective:   Physical Exam Vitals and nursing note reviewed.  Constitutional:      Appearance: She is well-developed. She is obese.  Cardiovascular:     Rate and Rhythm: Normal rate and regular rhythm.     Pulses: Normal pulses.     Heart sounds: Normal heart sounds.  Pulmonary:     Effort: Pulmonary  effort is normal.     Breath sounds: Normal breath sounds.  Abdominal:     General: Abdomen is flat. Bowel sounds are normal. There is no distension.     Palpations: Abdomen is soft.     Tenderness: There is generalized abdominal tenderness. There is no right CVA tenderness, left CVA tenderness or rebound.     Hernia: No hernia is present.  Skin:    General: Skin is warm and dry.  Neurological:     General: No focal deficit present.     Mental Status: She is alert and oriented to person, place, and time.  Psychiatric:        Mood and Affect: Mood normal.        Thought Content: Thought content normal.        Judgment: Judgment normal.       Assessment & Plan:  1. Generalized abdominal pain  - dicyclomine (BENTYL) 10 MG capsule; Take 1 capsule (10 mg total) by mouth 3 (three) times daily before meals for 15 days.  Dispense: 45 capsule; Refill: 1  2. Diarrhea, unspecified type -She is not taking Levsin.  We will try her on Bentyl short course.  She was advised to follow-up with gastroenterology and she does have an appointment for next month.  Also advised to try over-the-counter fiber supplements such as  Metamucil or Benefiber - dicyclomine (BENTYL) 10 MG capsule; Take 1 capsule (10 mg total) by mouth 3 (three) times daily before meals for 15 days.  Dispense: 45 capsule; Refill: 1  3. Nausea - Continue with zofran   Shirline Frees, NP

## 2020-10-08 ENCOUNTER — Other Ambulatory Visit: Payer: Self-pay | Admitting: Adult Health

## 2020-10-08 DIAGNOSIS — R197 Diarrhea, unspecified: Secondary | ICD-10-CM

## 2020-10-08 DIAGNOSIS — R1084 Generalized abdominal pain: Secondary | ICD-10-CM

## 2020-10-19 ENCOUNTER — Ambulatory Visit (HOSPITAL_COMMUNITY): Admit: 2020-10-19 | Discharge status: Against Medical Advice | Payer: BC Managed Care – PPO

## 2020-11-07 ENCOUNTER — Ambulatory Visit (INDEPENDENT_AMBULATORY_CARE_PROVIDER_SITE_OTHER): Payer: BC Managed Care – PPO | Admitting: Pediatric Gastroenterology

## 2020-11-07 ENCOUNTER — Encounter (INDEPENDENT_AMBULATORY_CARE_PROVIDER_SITE_OTHER): Payer: Self-pay | Admitting: Pediatric Gastroenterology

## 2020-11-07 ENCOUNTER — Other Ambulatory Visit: Payer: Self-pay

## 2020-11-07 ENCOUNTER — Telehealth: Payer: Self-pay | Admitting: Gastroenterology

## 2020-11-07 VITALS — BP 110/68 | HR 96 | Ht 61.97 in | Wt 193.4 lb

## 2020-11-07 DIAGNOSIS — K580 Irritable bowel syndrome with diarrhea: Secondary | ICD-10-CM

## 2020-11-07 MED ORDER — DULOXETINE HCL 20 MG PO CPEP: 20.0000 mg | ORAL_CAPSULE | Freq: Every day | ORAL | 3 refills | Status: DC

## 2020-11-07 NOTE — Telephone Encounter (Signed)
Pt's mother called to schedule an appt for the pt to be scheduled for abdominal pain/diarrhea. Pt saw a GI provider yesterday 12/19 advised mother to please fax records over to review.   Caller is requesting Dr Lavon Paganini

## 2020-11-07 NOTE — Patient Instructions (Addendum)
Contact information For emergencies after hours, on holidays or weekends: call [phone] and ask for the pediatric gastroenterologist on call.  For regular business hours: Pediatric GI phone number: Kimberly Henderson [phone] OR Use MyChart to send messages  A special favor Our waiting list is over 2 months. Other children are waiting to be seen in our clinic. If you cannot make your next appointment, please contact us with at least 2 days notice to cancel and reschedule. Your timely phone call will allow another child to use the clinic slot.  Thank you!  Hypnosis [email or web address]  Duloxetine delayed-release capsules What is this medicine? DULOXETINE (doo LOX e teen) is used to treat depression, anxiety, and different types of chronic pain. This medicine may be used for other purposes; ask your health care provider or pharmacist if you have questions. COMMON BRAND NAME(S): Cymbalta, Murrell Converse, Irenka What should I tell my health care provider before I take this medicine? They need to know if you have any of these conditions:  bipolar disorder  glaucoma  high blood pressure  kidney disease  liver disease  seizures  suicidal thoughts, plans or attempt; a previous suicide attempt by you or a family member  take medicines that treat or prevent blood clots  taken medicines called MAOIs like Carbex, Eldepryl, Marplan, Nardil, and Parnate within 14 days  trouble passing urine  an unusual reaction to duloxetine, other medicines, foods, dyes, or preservatives  pregnant or trying to get pregnant  breast-feeding How should I use this medicine? Take this medicine by mouth with a glass of water. Follow the directions on the prescription label. Do not crush, cut or chew some capsules of this medicine. Some capsules may be opened and sprinkled on applesauce. Check with your doctor or pharmacist if you are not sure. You can take this medicine with or  without food. Take your medicine at regular intervals. Do not take your medicine more often than directed. Do not stop taking this medicine suddenly except upon the advice of your doctor. Stopping this medicine too quickly may cause serious side effects or your condition may worsen. A special MedGuide will be given to you by the pharmacist with each prescription and refill. Be sure to read this information carefully each time. Talk to your pediatrician regarding the use of this medicine in children. While this drug may be prescribed for children as young as 66 years of age for selected conditions, precautions do apply. Overdosage: If you think you have taken too much of this medicine contact a poison control center or emergency room at once. NOTE: This medicine is only for you. Do not share this medicine with others. What if I miss a dose? If you miss a dose, take it as soon as you can. If it is almost time for your next dose, take only that dose. Do not take double or extra doses. What may interact with this medicine? Do not take this medicine with any of the following medications:  desvenlafaxine  levomilnacipran  linezolid  MAOIs like Carbex, Eldepryl, Marplan, Nardil, and Parnate  methylene blue (injected into a vein)  milnacipran  thioridazine  venlafaxine This medicine may also interact with the following medications:  alcohol  amphetamines  aspirin and aspirin-like medicines  certain antibiotics like ciprofloxacin and enoxacin  certain medicines for blood pressure, heart disease, irregular heart beat  certain medicines for depression, anxiety, or psychotic disturbances  certain medicines for migraine headache like almotriptan, eletriptan, frovatriptan, naratriptan, rizatriptan, sumatriptan, zolmitriptan  certain medicines that treat or prevent blood clots like warfarin, enoxaparin, and dalteparin  cimetidine  fentanyl  lithium  NSAIDS, medicines for pain and  inflammation, like ibuprofen or naproxen  phentermine  procarbazine  rasagiline  sibutramine  St. John's wort  theophylline  tramadol  tryptophan This list may not describe all possible interactions. Give your health care provider a list of all the medicines, herbs, non-prescription drugs, or dietary supplements you use. Also tell them if you smoke, drink alcohol, or use illegal drugs. Some items may interact with your medicine. What should I watch for while using this medicine? Tell your doctor if your symptoms do not get better or if they get worse. Visit your doctor or healthcare provider for regular checks on your progress. Because it may take several weeks to see the full effects of this medicine, it is important to continue your treatment as prescribed by your doctor. This medicine may cause serious skin reactions. They can happen weeks to months after starting the medicine. Contact your healthcare provider right away if you notice fevers or flu-like symptoms with a rash. The rash may be red or purple and then turn into blisters or peeling of the skin. Or, you might notice a red rash with swelling of the face, lips, or lymph nodes in your neck or under your arms. Patients and their families should watch out for new or worsening thoughts of suicide or depression. Also watch out for sudden changes in feelings such as feeling anxious, agitated, panicky, irritable, hostile, aggressive, impulsive, severely restless, overly excited and hyperactive, or not being able to sleep. If this happens, especially at the beginning of treatment or after a change in dose, call your healthcare provider. You may get drowsy or dizzy. Do not drive, use machinery, or do anything that needs mental alertness until you know how this medicine affects you. Do not stand or sit up quickly, especially if you are an older patient. This reduces the risk of dizzy or fainting spells. Alcohol may interfere with the effect of  this medicine. Avoid alcoholic drinks. This medicine can cause an increase in blood pressure. This medicine can also cause a sudden drop in your blood pressure, which may make you feel faint and increase the chance of a fall. These effects are most common when you first start the medicine or when the dose is increased, or during use of other medicines that can cause a sudden drop in blood pressure. Check with your doctor for instructions on monitoring your blood pressure while taking this medicine. Your mouth may get dry. Chewing sugarless gum or sucking hard candy, and drinking plenty of water, may help. Contact your doctor if the problem does not go away or is severe. What side effects may I notice from receiving this medicine? Side effects that you should report to your doctor or health care professional as soon as possible:  allergic reactions like skin rash, itching or hives, swelling of the face, lips, or tongue  anxious  breathing problems  confusion  changes in vision  chest pain  confusion  elevated mood, decreased need for sleep, racing thoughts, impulsive behavior  eye pain  fast, irregular heartbeat  feeling faint or lightheaded, falls  feeling agitated, angry, or irritable  hallucination, loss of contact with reality  high blood pressure  loss of balance or coordination  palpitations  redness, blistering, peeling or loosening of the skin, including inside the mouth  restlessness, pacing, inability to keep still  seizures  stiff muscles  suicidal thoughts or other mood changes  trouble passing urine or change in the amount of urine  trouble sleeping  unusual bleeding or bruising  unusually weak or tired  vomiting  yellowing of the eyes or skin Side effects that usually do not require medical attention (report to your doctor or health care professional if they continue or are bothersome):  change in sex drive or performance  change in appetite  or weight  constipation  dizziness  dry mouth  headache  increased sweating  nausea  tired This list may not describe all possible side effects. Call your doctor for medical advice about side effects. You may report side effects to FDA at 1-800-FDA-1088. Where should I keep my medicine? Keep out of the reach of children. Store at room temperature between 15 and 30 degrees C (59 to 86 degrees F). Throw away any unused medicine after the expiration date. NOTE: This sheet is a summary. It may not cover all possible information. If you have questions about this medicine, talk to your doctor, pharmacist, or health care provider.  2020 Elsevier/Gold Standard (2019-02-05 13:47:50)

## 2020-11-07 NOTE — Progress Notes (Signed)
Pediatric Gastroenterology Follow Up Visit   REFERRING PROVIDER:  Madelin Headings, MD [address] [place],  [place] [address]   ASSESSMENT:     I had the pleasure of seeing Kimberly Henderson, 19 y.o. female (DOB: Feb 25, 2001) who I saw in follow up today for evaluation of abdominal pain and loose stools.  Her previous symptoms of hematemesis, vomiting, nausea and postprandial pain have abated.  These are new symptoms.  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  There are options to treat her IBS with diarrhea.  Her symptoms are not responding to nortriptyline and she stopped it.  Therefore, other options include duloxetine and Viberzi.  We will begin treatment with duloxetine, because it is generic and cheaper than Viberzi.  In addition, she wanted to explore nonpharmacological therapies for her symptoms.  One of these therapies is hypnosis, which is valid therapy for IBS with diarrhea.  I recommended a therapist that works with our division.  She is frustrated that her symptoms continue despite a number of medications.      PLAN:  Duloxetine 20 mg at bedtime.  I provided a printout with information about duloxetine and discussed possible benefits and side effects of the medication.  Specifically, she may have some side effects before she sees benefit and therefore she should the patient with the medication for at least 2 to 3 weeks before calling it a failure. Provided a website for her to contact the hypnotherapist, who is an independent Location manager.   I encouraged her to give [place] a call in about 2 weeks with an update on how she is feeling.  I would like to see her back in 2 months. Repeat blood work to check hemoglobin, CMP and urinalysis on her next visit  Thank you for allowing [place] to participate in the care of your patient       HISTORY OF PRESENT ILLNESS: Kimberly Henderson is a 19 y.o. female (DOB: [birth year]) who is seen in follow up for evaluation of abdominal pain and diarrhea.  Her symptoms have been going on for several months.  She feels that nortriptyline and omeprazole are not helping and she stopped these medications.  Hyoscyamine provides some relief.  The pain is worse when she takes certain foods like dairy, spicy foods and foods that are fatty.  The pain tends to occur in the morning and affects her upper abdomen.  She has concomitant urgency to pass stool and when she passes stool her stools are loose.  Co-morbidities include obesity, and glucose intolerance. She is only drinking water and going to the gym and has lost weight.   She has had previous evaluation that has excluded gallstones by ultrasound.    She takes Mydol for menstrual cramps. For headaches she takes OTC headaches.  Past history Her symptoms are chronic, dating back to about 16 months at ago, in December 2017.  Her symptoms consist of diffuse upper abdominal pain that is made worse by eating.  Certain foods are particularly involved in triggering her symptoms including meat, spicy foods and dairy.  Usually she passes normal stool.  However when her abdominal pain is intense, she may pass loose stools.  She also complains of chronic nausea and early satiety.  Despite the symptoms, she continues to gain weight.  Her symptoms do not wake her up at night.  She has no fever, no oral lesions, no joint pains, no skin rashes and no perianal discomfort.  She has no arthralgia.  Metformin makes her symptoms worse and she only takes it intermittently.  She has regular menstrual cycles.  When she has her menstrual cycles she has loose stools and lower abdominal pain.  She takes NSAIDs to calm her symptoms.  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 PAST MEDICAL HISTORY: Past Medical History:  Diagnosis Date  . Asthma   . Eczema     type rash  . History of angioedema   . Obesity   . Seasonal allergies    singular as needed; worse in cold weather  . Twin birth, mate liveborn    Immunization History  Administered Date(s) Administered  . HPV 9-valent 07/29/2014, 10/05/2014, 02/28/2015  . Hepatitis A 09/24/2011  . Hepatitis A, Ped/Adol-2 Dose 07/10/2013  . Influenza Split 09/19/2011, 11/05/2012  . Influenza,inj,Quad PF,6+ Mos 07/29/2014, 11/22/2015  . Meningococcal Conjugate 07/10/2013  . PPD Test 09/28/2019  . Tdap 06/11/2012   PAST SURGICAL HISTORY: History reviewed. No pertinent surgical history. SOCIAL HISTORY: Social History   Socioeconomic History  . Marital status: Single    Spouse name: Not on file  . Number of children: Not on file  . Years of education: Not on file  . Highest education level: Not on file  Occupational History  . Not on file  Tobacco Use  . Smoking status: Never Smoker  . Smokeless tobacco: Never Used  Vaping Use  . Vaping Use: Never used  Substance and Sexual Activity  . Alcohol use: No  . Drug use: No  . Sexual activity: Not Currently  Other Topics Concern  . Not on file  Social History Narrative   Finished HS Feb 2021. Lives with mom and dad.         Social Determinants of Health   Financial Resource Strain: Not on file  Food Insecurity: Not on file  Transportation Needs: Not on file  Physical Activity: Not on file  Stress: Not on file  Social Connections: Not on file   FAMILY HISTORY: family history includes Colitis in her mother; Diabetes in her maternal grandmother and paternal grandmother; GER disease in her mother; Hypertension in her maternal aunt, maternal grandmother, and paternal grandmother; Irritable bowel syndrome in her mother; Obesity in her sister; Thyroid disease in her maternal grandmother.   REVIEW OF SYSTEMS:  The balance of 12 systems reviewed is negative except as noted in the HPI.  MEDICATIONS: Current Outpatient Medications   Medication Sig Dispense Refill  . Levocetirizine Dihydrochloride (XYZAL PO) Take by mouth.    Marland Kitchen. albuterol (PROVENTIL HFA;VENTOLIN HFA) 108 (90 BASE) MCG/ACT inhaler Inhale 2 puffs into the lungs every 6 (six) hours as needed for wheezing or shortness of breath (chest pain). 1 Inhaler 1  . DULoxetine (CYMBALTA) 20 MG capsule Take 1 capsule (20 mg total) by mouth daily. 30 capsule 3  . EPINEPHrine (EPIPEN 2-PAK) 0.3 mg/0.3 mL SOAJ injection Inject 0.3 mLs (0.3 mg total) into the muscle once as needed (for severe allergic reaction). CAll 911 immediately if you have to use this medicine 1 Device 0  . Hyoscyamine Sulfate SL (LEVSIN/SL) 0.125 MG SUBL Place 1 tablet under the tongue at bedtime as needed and may repeat dose one time if needed. 30 tablet 1  . ondansetron (ZOFRAN ODT) 4 MG disintegrating tablet Take 1 tablet (4 mg total) by mouth  every 8 (eight) hours as needed for nausea or vomiting. 20 tablet 0  . Spacer/Aero-Holding Chambers (E-Z SPACER) inhaler Use as instructed 1 each 0   No current facility-administered medications for this visit.   ALLERGIES: Patient has no known allergies.  VITAL SIGNS: BP 110/68   Pulse 96   Ht 5' 1.97" (1.574 m)   Wt 193 lb 6 oz (87.7 kg)   LMP 11/06/2020 (Exact Date)   BMI 35.40 kg/m  PHYSICAL EXAM: Constitutional: Alert, no acute distress, obese, and well hydrated.  Mental Status: Pleasantly interactive, not anxious appearing. HEENT: PERRL, conjunctiva clear, anicteric, oropharynx clear, neck supple, no LAD. Respiratory: Clear to auscultation, unlabored breathing. Cardiac: Euvolemic, regular rate and rhythm, normal S1 and S2, no murmur. Abdomen: Soft, normal bowel sounds, non-distended, non-tender, no organomegaly or masses. Abdominal striae. Perianal/Rectal Exam: Not examined Extremities: No edema, well perfused. Musculoskeletal: No joint swelling or tenderness noted, no deformities. Skin: No rashes, jaundice or skin lesions noted. Neuro: No  focal deficits.   DIAGNOSTIC STUDIES:  I have reviewed all pertinent diagnostic studies, including: CBC    Component Value Date/Time   WBC 5.3 09/15/2020 1330   RBC 3.58 (L) 09/15/2020 1330   HGB 11.9 (L) 09/15/2020 1330   HCT 34.9 (L) 09/15/2020 1330   PLT 347 09/15/2020 1330   MCV 97.5 09/15/2020 1330   MCH 33.2 09/15/2020 1330   MCHC 34.1 09/15/2020 1330   RDW 13.2 09/15/2020 1330   LYMPHSABS 1.1 09/15/2020 1330   MONOABS 0.2 09/15/2020 1330   EOSABS 0.0 09/15/2020 1330   BASOSABS 0.0 09/15/2020 1330   CMP Latest Ref Rng & Units 09/15/2020 04/28/2018 02/24/2018  Glucose 70 - 99 mg/dL 86 94 94  BUN 6 - 20 mg/dL 6 6(L) <8(J)  Creatinine 0.44 - 1.00 mg/dL 1.91 4.78 2.95  Sodium 135 - 145 mmol/L 141 139 137  Potassium 3.5 - 5.1 mmol/L 3.2(L) 4.0 3.4(L)  Chloride 98 - 111 mmol/L 104 104 99(L)  CO2 22 - 32 mmol/L Calcium 8.9 - 10.3 mg/dL 9.0 9.4 9.0  Total Protein 6.3 - 8.2 g/dL - 6.9 7.2  Total Bilirubin 0.2 - 1.1 mg/dL - 0.4 0.8  Alkaline Phos 47 - 119 U/L - - 55  AST 12 - 32 U/L - 13 34  ALT 5 - 32 U/L - 7 11(L)    Recent Results (from the past 2160 hour(s))  POC Urinalysis dipstick     Status: Abnormal   Collection Time: 09/15/20 12:20 PM  Result Value Ref Range   Glucose, UA NEGATIVE NEGATIVE mg/dL   Bilirubin Urine MODERATE (A) NEGATIVE   Ketones, ur >=160 (A) NEGATIVE mg/dL   Specific Gravity, Urine 1.025 1.005 - 1.030   Hgb urine dipstick TRACE (A) NEGATIVE   pH 7.0 5.0 - 8.0   Protein, ur 100 (A) NEGATIVE mg/dL   Urobilinogen, UA 1.0 0.0 - 1.0 mg/dL   Nitrite NEGATIVE NEGATIVE   Leukocytes,Ua TRACE (A) NEGATIVE    Comment: Biochemical Testing Only. Please order routine urinalysis from main lab if confirmatory testing is needed.  POC urine pregnancy     Status: None   Collection Time: 09/15/20 12:26 PM  Result Value Ref Range   Preg Test, Ur NEGATIVE NEGATIVE    Comment:        THE SENSITIVITY OF THIS METHODOLOGY IS >24 mIU/mL   Resp Panel by RT  PCR (RSV, Flu A&B, Covid) - Nasopharyngeal Swab     Status: None  Collection Time: 09/15/20 12:30 PM   Specimen: Nasopharyngeal Swab  Result Value Ref Range   SARS Coronavirus 2 by RT PCR NEGATIVE NEGATIVE    Comment: (NOTE) SARS-CoV-2 target nucleic acids are NOT DETECTED.  The SARS-CoV-2 RNA is generally detectable in upper respiratoy specimens during the acute phase of infection. The lowest concentration of SARS-CoV-2 viral copies this assay can detect is 131 copies/mL. A negative result does not preclude SARS-Cov-2 infection and should not be used as the sole basis for treatment or other patient management decisions. A negative result may occur with  improper specimen collection/handling, submission of specimen other than nasopharyngeal swab, presence of viral mutation(s) within the areas targeted by this assay, and inadequate number of viral copies (<131 copies/mL). A negative result must be combined with clinical observations, patient history, and epidemiological information. The expected result is Negative.  Fact Sheet for Patients:  https://www.moore.com/  Fact Sheet for Healthcare Providers:  https://www.young.biz/  This test is no t yet approved or cleared by the [place] FDA and  has been authorized for detection and/or diagnosis of SARS-CoV-2 by FDA under an Emergency Use Authorization (EUA). This EUA will remain  in effect (meaning this test can be used) for the duration of the COVID-19 declaration under Section 564(b)(1) of the Act, 21 U.S.C. section 360bbb-3(b)(1), unless the authorization is terminated or revoked sooner.     Influenza A by PCR NEGATIVE NEGATIVE   Influenza B by PCR NEGATIVE NEGATIVE    Comment: (NOTE) The Xpert Xpress SARS-CoV-2/FLU/RSV assay is intended as an aid in  the diagnosis of influenza from Nasopharyngeal swab specimens and  should not be used as a sole basis for treatment. Nasal washings  and  aspirates are unacceptable for Xpert Xpress SARS-CoV-2/FLU/RSV  testing.  Fact Sheet for Patients: https://www.moore.com/  Fact Sheet for Healthcare Providers: https://www.young.biz/  This test is not yet approved or cleared by the [place] FDA and  has been authorized for detection and/or diagnosis of SARS-CoV-2 by  FDA under an Emergency Use Authorization (EUA). This EUA will remain  in effect (meaning this test can be used) for the duration of the  Covid-19 declaration under Section 564(b)(1) of the Act, 21  U.S.C. section 360bbb-3(b)(1), unless the authorization is  terminated or revoked.    Respiratory Syncytial Virus by PCR NEGATIVE NEGATIVE    Comment: (NOTE) Fact Sheet for Patients: https://www.moore.com/  Fact Sheet for Healthcare Providers: https://www.young.biz/  This test is not yet approved or cleared by the [place] FDA and  has been authorized for detection and/or diagnosis of SARS-CoV-2 by  FDA under an Emergency Use Authorization (EUA). This EUA will remain  in effect (meaning this test can be used) for the duration of the  COVID-19 declaration under Section 564(b)(1) of the Act, 21 U.S.C.  section 360bbb-3(b)(1), unless the authorization is terminated or  revoked. Performed at [facility], [address] [place], [place] [address]   CBC with Differential     Status: Abnormal   Collection Time: 09/15/20  1:30 PM  Result Value Ref Range   WBC 5.3 4.0 - 10.5 K/uL   RBC 3.58 (L) 3.87 - 5.11 MIL/uL   Hemoglobin 11.9 (L) 12.0 - 15.0 g/dL   HCT 81.8 (L) 56.3 - 14.9 %   MCV 97.5 80.0 - 100.0 fL   MCH 33.2 26.0 - 34.0 pg   MCHC 34.1 30.0 - 36.0 g/dL   RDW 70.2 63.7 - 85.8 %   Platelets 347 150 -  400 K/uL   nRBC 0.0 0.0 - 0.2 %   Neutrophils Relative % 75 %   Neutro Abs 4.0 1.7 - 7.7 K/uL   Lymphocytes Relative 21 %   Lymphs Abs 1.1 0.7 - 4.0 K/uL   Monocytes  Relative 4 %   Monocytes Absolute 0.2 0.1 - 1.0 K/uL   Eosinophils Relative 0 %   Eosinophils Absolute 0.0 0.0 - 0.5 K/uL   Basophils Relative 0 %   Basophils Absolute 0.0 0.0 - 0.1 K/uL   Immature Granulocytes 0 %   Abs Immature Granulocytes 0.01 0.00 - 0.07 K/uL    Comment: Performed at [facility], [address], [place], [place] [address]  Basic metabolic panel     Status: Abnormal   Collection Time: 09/15/20  1:30 PM  Result Value Ref Range   Sodium 141 135 - 145 mmol/L   Potassium 3.2 (L) 3.5 - 5.1 mmol/L   Chloride 104 98 - 111 mmol/L   CO2 26 22 - 32 mmol/L   Glucose, Bld 86 70 - 99 mg/dL    Comment: Glucose reference range applies only to samples taken after fasting for at least 8 hours.   BUN 6 6 - 20 mg/dL   Creatinine, Ser 0.10 0.44 - 1.00 mg/dL   Calcium 9.0 8.9 - 93.2 mg/dL   GFR, Estimated >35 >57 mL/min    Comment: (NOTE) Calculated using the CKD-EPI Creatinine Equation (2021)    Anion gap 11 5 - 15    Comment: Performed at [facility], [address], [place], [place] [address]  Lipase, blood     Status: None   Collection Time: 09/15/20  1:30 PM  Result Value Ref Range   Lipase 23 11 - 51 U/L    Comment: Performed at [facility], [address], [place], [place] [address]    Ultrasound 1. The liver echogenicity is somewhat inhomogeneous and overall increased. Suspect hepatic steatosis. While no focal liver lesions are evident, it must be cautioned that the sensitivity of ultrasound for detection of focal liver lesions is diminished in this circumstance.  2. Portions of pancreas obscured by gas. Visualized portions of pancreas appear normal  Reggie Welge A. Jacqlyn Krauss, MD Chief, Division of Pediatric Gastroenterology Professor of Pediatrics

## 2020-11-22 ENCOUNTER — Encounter: Payer: Self-pay | Admitting: Internal Medicine

## 2020-11-22 ENCOUNTER — Telehealth (INDEPENDENT_AMBULATORY_CARE_PROVIDER_SITE_OTHER): Payer: Self-pay | Admitting: Internal Medicine

## 2020-11-22 VITALS — Ht 62.0 in | Wt 193.0 lb

## 2020-11-22 DIAGNOSIS — R6889 Other general symptoms and signs: Secondary | ICD-10-CM

## 2020-11-22 DIAGNOSIS — R55 Syncope and collapse: Secondary | ICD-10-CM

## 2020-11-22 NOTE — Progress Notes (Signed)
Virtual Visit via Video Note  I connected with@ on 11/22/20 at  4:00 PM EST by a video enabled telemedicine application and verified that I am speaking with the correct person using two identifiers. Location patient: home Location provider:work  office Persons participating in the virtual visit: patient, provider  [place]th national recommendations  regarding COVID 19 pandemic   video visit is advised over in office visit for this patient.  Patient aware  of the limitations of evaluation and management by telemedicine and  availability of in person appointments. and agreed to proceed.   HPI: Kimberly Henderson presents for video visit sda for illness  Began to have headache and low-grade fever January December 27 28th time persisted then developed and got a sore throat.  Got tested for Covid December 29 rapid test negative PCR test is pending and delayed results so far.  January 1 when she was taking a shower felt lightheaded and had a faint but didn't end up hitting her head just remembers waking up.  There was no tachycardia neurologic symptoms with this. Since then she feels a bit tired fatigued Easterday had a temp of 100.1 by forehead.  No further fainting. Question about when to go back to work to supervisor above suggestion 7 days from the onset.  Other history Brother had positive test for Covid early December then mom had a positive test with a mild symptoms later in the month she just got out of isolation.  Family stayed away from each other to prevent infection. She was immunized  in May with the primary series.for sars Cov 2  No new meds  LMP 12 20 and normal  Denies risk of preg ROS: See pertinent positives and negatives per HPI.  Past Medical History:  Diagnosis Date  . Asthma   . Eczema    type rash  . History of angioedema   . Obesity   . Seasonal allergies    singular as needed; worse in cold weather  . Twin birth, mate liveborn     No past surgical history on  file.  Family History  Problem Relation Age of Onset  . Irritable bowel syndrome Mother   . Colitis Mother   . GER disease Mother   . Obesity Sister   . Hypertension Maternal Aunt   . Diabetes Maternal Grandmother   . Hypertension Maternal Grandmother   . Thyroid disease Maternal Grandmother   . Diabetes Paternal Grandmother   . Hypertension Paternal Grandmother     Social History   Tobacco Use  . Smoking status: Never Smoker  . Smokeless tobacco: Never Used  Vaping Use  . Vaping Use: Never used  Substance Use Topics  . Alcohol use: No  . Drug use: No      Current Outpatient Medications:  .  albuterol (PROVENTIL HFA;VENTOLIN HFA) 108 (90 BASE) MCG/ACT inhaler, Inhale 2 puffs into the lungs every 6 (six) hours as needed for wheezing or shortness of breath (chest pain)., Disp: 1 Inhaler, Rfl: 1 .  DULoxetine (CYMBALTA) 20 MG capsule, Take 1 capsule (20 mg total) by mouth daily., Disp: 30 capsule, Rfl: 3 .  EPINEPHrine (EPIPEN 2-PAK) 0.3 mg/0.3 mL SOAJ injection, Inject 0.3 mLs (0.3 mg total) into the muscle once as needed (for severe allergic reaction). CAll 911 immediately if you have to use this medicine, Disp: 1 Device, Rfl: 0 .  Hyoscyamine Sulfate SL (LEVSIN/SL) 0.125 MG SUBL, Place 1 tablet under the tongue at bedtime as needed and may repeat  dose one time if needed., Disp: 30 tablet, Rfl: 1 .  ondansetron (ZOFRAN ODT) 4 MG disintegrating tablet, Take 1 tablet (4 mg total) by mouth every 8 (eight) hours as needed for nausea or vomiting., Disp: 20 tablet, Rfl: 0 .  Spacer/Aero-Holding Chambers (E-Z SPACER) inhaler, Use as instructed, Disp: 1 each, Rfl: 0 .  Levocetirizine Dihydrochloride (XYZAL PO), Take by mouth. (Patient not taking: Reported on 11/22/2020), Disp: , Rfl:   EXAM: BP Readings from Last 3 Encounters:  11/07/20 110/68  09/21/20 112/72  09/15/20 119/64    VITALS per patient if applicable:  GENERAL: alert, oriented, appears well and in no acute  distress HEENT: atraumatic, conjunttiva clear, no obvious abnormalities on inspection of external nose and earsnon toxic  Nl speech and cognition  No cough during visit  NECK: normal movements of the head and neck LUNGS: on inspection no signs of respiratory distress, breathing rate appears normal, no obvious gross SOB, gasping or wheezing CV: no obvious cyanosis MS: moves all visible extremities without noticeable abnormality PSYCH/NEURO: pleasant and cooperative, no obvious depression or anxiety, speech and thought processing grossly intact Lab Results  Component Value Date   WBC 5.3 09/15/2020   HGB 11.9 (L) 09/15/2020   HCT 34.9 (L) 09/15/2020   PLT 347 09/15/2020   GLUCOSE 86 09/15/2020   CHOL 157 12/06/2014   TRIG 63 12/06/2014   HDL 53 12/06/2014   LDLCALC 91 12/06/2014   ALT 7 04/28/2018   AST 13 04/28/2018   NA 141 09/15/2020   K 3.2 (L) 09/15/2020   CL 104 09/15/2020   CREATININE 0.58 09/15/2020   BUN 6 09/15/2020   CO2 26 09/15/2020   TSH 2.00 03/08/2017   HGBA1C 5.5 11/04/2018    ASSESSMENT AND PLAN:  Discussed the following assessment and plan:    ICD-10-CM   1. Flu-like symptoms  poss covid exposures  R68.89   2. Syncope, unspecified syncope type  R55    prob related  to  situation vagal and hydration and illness    Awaiting Covid test but clinically continue out of work return Friday, January 7 if improved and no fever. Also advised follow-up in person visit in 1 to 2 weeks to follow-up for episode of syncope.  Probably illness related Counseled.   Expectant management and discussion of plan and treatment with opportunity to ask questions and all were answered. The patient agreed with the plan and demonstrated an understanding of the instructions.   Advised to call back or seek an in-person evaluation if worsening  or having  further concerns . Return for 1-2 weeks  for hx of symcope .  Berniece Andreas, MD

## 2020-12-21 ENCOUNTER — Ambulatory Visit: Payer: BC Managed Care – PPO | Admitting: Gastroenterology

## 2021-01-09 ENCOUNTER — Other Ambulatory Visit: Payer: Self-pay

## 2021-01-09 ENCOUNTER — Ambulatory Visit (INDEPENDENT_AMBULATORY_CARE_PROVIDER_SITE_OTHER): Payer: BC Managed Care – PPO | Admitting: Pediatric Gastroenterology

## 2021-01-09 ENCOUNTER — Encounter (INDEPENDENT_AMBULATORY_CARE_PROVIDER_SITE_OTHER): Payer: Self-pay | Admitting: Pediatric Gastroenterology

## 2021-01-09 VITALS — BP 116/76 | HR 104 | Ht 62.76 in | Wt 190.4 lb

## 2021-01-09 DIAGNOSIS — K580 Irritable bowel syndrome with diarrhea: Secondary | ICD-10-CM

## 2021-01-09 NOTE — Progress Notes (Signed)
Pediatric Gastroenterology Follow Up Visit   REFERRING PROVIDER:  Madelin Headings, MD [address] [place],  [place] [address]   ASSESSMENT:     I had the pleasure of seeing Kimberly Henderson, 20 y.o. female (DOB: February 13, 2001) who I saw in follow up today for evaluation of abdominal pain and loose stools.  Her previous symptoms of hematemesis, vomiting, nausea and postprandial pain have abated.  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 She attributes her improvement to a vitamin/probiotic/prebiotic/fiber supplement and lifestyle changes.  She never took duloxetine.     PLAN:  She will transition to adult GI  Thank you for allowing [place] to participate in the care of your patient      HISTORY OF PRESENT ILLNESS: Kimberly Henderson is a 20 y.o. female (DOB: 12-09-2000) who is seen in follow up for evaluation of abdominal pain and diarrhea.  Her symptoms have resolved with changing her diet (no meat or spicy foods, reduced lactose intake) and supplements that contain pre- and probiotics, fiber, and vitamins. 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  She has had previous evaluation that has excluded gallstones by ultrasound.    She takes Mydol for menstrual cramps. For headaches she takes OTC headaches.  Past history Her symptoms are chronic, dating back to about 16 months at ago, in December 2017.  Her symptoms consist of diffuse upper abdominal pain that is made worse by eating.  Certain foods are particularly involved in triggering her symptoms including meat, spicy foods and dairy.  Usually she passes normal stool.  However when her abdominal pain is intense, she may pass loose stools.  She also complains of chronic nausea and early satiety.  Despite the symptoms, she continues to gain weight.  Her symptoms do not wake her up at night.  She has no fever, no oral lesions, no joint pains, no skin rashes and no perianal discomfort.  She  has no arthralgia.  Metformin makes her symptoms worse and she only takes it intermittently.  She has regular menstrual cycles.  When she has her menstrual cycles she has loose stools and lower abdominal pain.  She takes NSAIDs to calm her symptoms.  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 PAST MEDICAL HISTORY: Past Medical History:  Diagnosis Date  . Asthma   . Eczema    type rash  . History of angioedema   . Obesity   . Seasonal allergies    singular as needed; worse in cold weather  . Twin birth, mate liveborn    Immunization History  Administered Date(s) Administered  . HPV 9-valent 07/29/2014, 10/05/2014, 02/28/2015  . Hepatitis A 09/24/2011  . Hepatitis A, Ped/Adol-2 Dose 07/10/2013  . Influenza Split 09/19/2011, 11/05/2012  . Influenza,inj,Quad PF,6+ Mos 07/29/2014, 11/22/2015  . Meningococcal Conjugate 07/10/2013  . PFIZER(Purple Top)SARS-COV-2 Vaccination 03/23/2020, 04/12/2020  . PPD Test 09/28/2019  . Tdap 06/11/2012   PAST SURGICAL HISTORY: No past surgical history on file. SOCIAL HISTORY: Social History   Socioeconomic History  . Marital status: Single    Spouse name: Not on file  . Number of children: Not on file  . Years of education: Not on file  . Highest education level: Not on file  Occupational History  . Not on file  Tobacco Use  . Smoking status: Never Smoker  . Smokeless tobacco: Never Used  Vaping Use  . Vaping  Use: Never used  Substance and Sexual Activity  . Alcohol use: No  . Drug use: No  . Sexual activity: Not Currently  Other Topics Concern  . Not on file  Social History Narrative   Finished HS Feb 2021. Lives with mom and dad. Working          International aid/development worker of Community education officer: Not on [employer] Insecurity: Not on file  Transportation Needs: Not on file  Physical Activity: Not on file  Stress: Not on file  Social Connections: Not on file   FAMILY HISTORY: family  history includes Colitis in her mother; Diabetes in her maternal grandmother and paternal grandmother; GER disease in her mother; Hypertension in her maternal aunt, maternal grandmother, and paternal grandmother; Irritable bowel syndrome in her mother; Obesity in her sister; Thyroid disease in her maternal grandmother.   REVIEW OF SYSTEMS:  The balance of 12 systems reviewed is negative except as noted in the HPI.  MEDICATIONS: Current Outpatient Medications  Medication Sig Dispense Refill  . albuterol (PROVENTIL HFA;VENTOLIN HFA) 108 (90 BASE) MCG/ACT inhaler Inhale 2 puffs into the lungs every 6 (six) hours as needed for wheezing or shortness of breath (chest pain). 1 Inhaler 1  . EPINEPHrine (EPIPEN 2-PAK) 0.3 mg/0.3 mL SOAJ injection Inject 0.3 mLs (0.3 mg total) into the muscle once as needed (for severe allergic reaction). CAll 911 immediately if you have to use this medicine 1 Device 0  . levocetirizine (XYZAL) 5 MG tablet SMARTSIG:1 Tablet(s) By Mouth Every Evening    . Spacer/Aero-Holding Chambers (E-Z SPACER) inhaler Use as instructed 1 each 0   No current facility-administered medications for this visit.   ALLERGIES: Patient has no known allergies.  VITAL SIGNS: BP 116/76   Pulse (!) 104   Ht 5' 2.76" (1.594 m)   Wt 190 lb 6.4 oz (86.4 kg)   BMI 33.99 kg/m  PHYSICAL EXAM: Constitutional: Alert, no acute distress, obese, and well hydrated.  Mental Status: Pleasantly interactive, not anxious appearing. HEENT: PERRL, conjunctiva clear, anicteric, oropharynx clear, neck supple, no LAD. Respiratory: Clear to auscultation, unlabored breathing. Cardiac: Euvolemic, regular rate and rhythm, normal S1 and S2, no murmur. Abdomen: Soft, normal bowel sounds, non-distended, non-tender, no organomegaly or masses. Abdominal striae. Perianal/Rectal Exam: Not examined Extremities: No edema, well perfused. Musculoskeletal: No joint swelling or tenderness noted, no deformities. Skin: No  rashes, jaundice or skin lesions noted. Neuro: No focal deficits.   DIAGNOSTIC STUDIES:  I have reviewed all pertinent diagnostic studies, including: CBC    Component Value Date/Time   WBC 5.3 09/15/2020 1330   RBC 3.58 (L) 09/15/2020 1330   HGB 11.9 (L) 09/15/2020 1330   HCT 34.9 (L) 09/15/2020 1330   PLT 347 09/15/2020 1330   MCV 97.5 09/15/2020 1330   MCH 33.2 09/15/2020 1330   MCHC 34.1 09/15/2020 1330   RDW 13.2 09/15/2020 1330   LYMPHSABS 1.1 09/15/2020 1330   MONOABS 0.2 09/15/2020 1330   EOSABS 0.0 09/15/2020 1330   BASOSABS 0.0 09/15/2020 1330   CMP Latest Ref Rng & Units 09/15/2020 04/28/2018 02/24/2018  Glucose 70 - 99 mg/dL 86 94 94  BUN 6 - 20 mg/dL 6 6(L) <7(J)  Creatinine 0.44 - 1.00 mg/dL 6.96 7.89 3.81  Sodium 135 - 145 mmol/L 141 139 137  Potassium 3.5 - 5.1 mmol/L 3.2(L) 4.0 3.4(L)  Chloride 98 - 111 mmol/L 104 104 99(L)  CO2 22 - 32 mmol/L 26 26 25   Calcium 8.9 -  10.3 mg/dL 9.0 9.4 9.0  Total Protein 6.3 - 8.2 g/dL - 6.9 7.2  Total Bilirubin 0.2 - 1.1 mg/dL - 0.4 0.8  Alkaline Phos 47 - 119 U/L - - 55  AST 12 - 32 U/L - 13 34  ALT 5 - 32 U/L - 7 11(L)    No results found for this or any previous visit (from the past 2160 hour(s)).  Ultrasound 1. The liver echogenicity is somewhat inhomogeneous and overall increased. Suspect hepatic steatosis. While no focal liver lesions are evident, it must be cautioned that the sensitivity of ultrasound for detection of focal liver lesions is diminished in this circumstance.  2. Portions of pancreas obscured by gas. Visualized portions of pancreas appear normal  Francisco A. Jacqlyn Krauss, MD Chief, Division of Pediatric Gastroenterology Professor of Pediatrics

## 2021-01-09 NOTE — Patient Instructions (Signed)

## 2021-03-13 ENCOUNTER — Encounter (HOSPITAL_COMMUNITY): Payer: Self-pay

## 2021-03-13 ENCOUNTER — Other Ambulatory Visit: Payer: Self-pay

## 2021-03-13 ENCOUNTER — Ambulatory Visit (HOSPITAL_COMMUNITY)
Admission: EM | Admit: 2021-03-13 | Discharge: 2021-03-13 | Disposition: A | Discharge status: Home / Self Care | Payer: BC Managed Care – PPO | Attending: Emergency Medicine | Admitting: Emergency Medicine

## 2021-03-13 DIAGNOSIS — J069 Acute upper respiratory infection, unspecified: Secondary | ICD-10-CM

## 2021-03-13 MED ORDER — PREDNISONE 20 MG PO TABS: 40.0000 mg | ORAL_TABLET | Freq: Every day | ORAL | 0 refills | Status: DC

## 2021-03-13 NOTE — ED Triage Notes (Signed)
Pt in with c/o migraine with eye pain, runny nose, chest tightness, sneezing that has been going on since Friday   Pt has been taking ibuprofen with no relief from sx  Pt states her chest feel tight when she coughs or sneezes and she hasn't been using her inhaler because it makes her woozy

## 2021-03-13 NOTE — Discharge Instructions (Signed)
Take 2 pills of prednisone every day for 5 days in the morning   Can continue use of the inhaler every 6 hours as needed  Can try 800 mg of ibuprofen for headache every 8 hours as needed   Can use otc the cough, cold and flu medication for symptom relief

## 2021-03-13 NOTE — ED Provider Notes (Signed)
[facility]    CSN: [id number] Arrival date & time: 03/13/21  1335      History   Chief Complaint Chief Complaint  Patient presents with  . Headache  . Cough  . Sneezing  . Nasal Congestion    HPI Kimberly Henderson is a 20 y.o. female.   Patient presents with headache, rhinorrhea, non productive cough, congestion, sore throat, sneezing and chest tightness for three days. Denies fever, chills, body aches, ear pain, sinus pressure.   Has used 400 mg ibuprofen with no relief and has used albuterol inhaler with no relief. History of asthma. Denies sick contact.   Past Medical History:  Diagnosis Date  . Asthma   . Eczema    type rash  . History of angioedema   . Obesity   . Seasonal allergies    singular as needed; worse in cold weather  . Twin birth, mate liveborn     Patient Active Problem List   Diagnosis Date Noted  . Oligomenorrhea 09/04/2016  . Wears glasses 11/22/2015  . Prolonged periods 11/22/2015  . Well adolescent visit 07/29/2014  . Dysmenorrhea 07/29/2014  . Facial rash 01/05/2014  . BMI, pediatric > 99% for age 38/22/2014  . Prediabetes 01/31/2012  . Dyspepsia 01/31/2012  . Exercise-induced asthma 09/24/2011  . Acquired acanthosis nigricans 09/24/2011    History reviewed. No pertinent surgical history.  OB History   No obstetric history on file.      Home Medications    Prior to Admission medications   Medication Sig Start Date End Date Taking? Authorizing Provider  predniSONE (DELTASONE) 20 MG tablet Take 2 tablets (40 mg total) by mouth daily. 03/13/21  Yes Taqwa Deem R, NP  albuterol (PROVENTIL HFA;VENTOLIN HFA) 108 (90 BASE) MCG/ACT inhaler Inhale 2 puffs into the lungs every 6 (six) hours as needed for wheezing or shortness of breath (chest pain). 01/07/15   Panosh, Neta Mends, MD  EPINEPHrine (EPIPEN 2-PAK) 0.3 mg/0.3 mL SOAJ injection Inject 0.3 mLs (0.3 mg total) into the muscle once as needed (for severe allergic reaction).  CAll 911 immediately if you have to use this medicine 01/04/14   Panosh, Neta Mends, MD  levocetirizine (XYZAL) 5 MG tablet SMARTSIG:1 Tablet(s) By Mouth Every Evening 11/22/20   [provider]  Spacer/Aero-Holding Chambers (E-Z SPACER) inhaler Use as instructed 11/26/18   Wynn Banker, MD    Family History Family History  Problem Relation Age of Onset  . Irritable bowel syndrome Mother   . Colitis Mother   . GER disease Mother   . Obesity Sister   . Hypertension Maternal Aunt   . Diabetes Maternal Grandmother   . Hypertension Maternal Grandmother   . Thyroid disease Maternal Grandmother   . Diabetes Paternal Grandmother   . Hypertension Paternal Grandmother     Social History Social History   Tobacco Use  . Smoking status: Never Smoker  . Smokeless tobacco: Never Used  Vaping Use  . Vaping Use: Never used  Substance Use Topics  . Alcohol use: No  . Drug use: No     Allergies   Patient has no known allergies.   Review of Systems Review of Systems  Defer to HPI    Physical Exam Triage Vital Signs ED Triage Vitals  Enc Vitals Group     BP 03/13/21 1431 120/76     Pulse Rate 03/13/21 1431 98     Resp 03/13/21 1431 17     Temp 03/13/21 1431 99  F (37.2 C)     Temp src --      SpO2 03/13/21 1431 96 %     Weight --      Height --      Head Circumference --      Peak Flow --      Pain Score 03/13/21 1430 10     Pain Loc --      Pain Edu? --      Excl. in GC? --    No data found.  Updated Vital Signs BP 120/76   Pulse 98   Temp 99 F (37.2 C)   Resp 17   LMP 02/24/2021 (Exact Date)   SpO2 96%   Visual Acuity Right Eye Distance:   Left Eye Distance:   Bilateral Distance:    Right Eye Near:   Left Eye Near:    Bilateral Near:     Physical Exam Constitutional:      Appearance: She is well-developed and normal weight.  HENT:     Head: Normocephalic.     Right Ear: Tympanic membrane, ear canal and external ear normal.     Left Ear:  Tympanic membrane, ear canal and external ear normal.     Nose: Congestion and rhinorrhea present.     Mouth/Throat:     Mouth: Mucous membranes are moist.     Pharynx: Posterior oropharyngeal erythema present.  Eyes:     Extraocular Movements: Extraocular movements intact.     Conjunctiva/sclera: Conjunctivae normal.     Pupils: Pupils are equal, round, and reactive to light.  Cardiovascular:     Rate and Rhythm: Normal rate and regular rhythm.     Pulses: Normal pulses.     Heart sounds: Normal heart sounds.  Pulmonary:     Effort: Pulmonary effort is normal.     Breath sounds: Normal breath sounds.  Musculoskeletal:        General: Normal range of motion.     Cervical back: Normal range of motion.  Lymphadenopathy:     Cervical: Cervical adenopathy present.  Skin:    General: Skin is warm and dry.  Neurological:     Mental Status: She is alert and oriented to person, place, and time. Mental status is at baseline.  Psychiatric:        Mood and Affect: Mood normal.        Behavior: Behavior normal.        Thought Content: Thought content normal.        Judgment: Judgment normal.      UC Treatments / Results  Labs (all labs ordered are listed, but only abnormal results are displayed) Labs Reviewed - No data to display  EKG   Radiology No results found.  Procedures Procedures (including critical care time)  Medications Ordered in UC Medications - No data to display  Initial Impression / Assessment and Plan / UC Course  I have reviewed the triage vital signs and the nursing notes.  Pertinent labs & imaging results that were available during my care of the patient were reviewed by me and considered in my medical decision making (see chart for details).  Viral URI with cough  1. Prednisone 40 mg daily for 5 days 2. Continue use of albuterol inhaler 3. otc medications for symptom management  Final Clinical Impressions(s) / UC Diagnoses   Final diagnoses:   Viral URI with cough     Discharge Instructions     Take 2 pills of prednisone  every day for 5 days in the morning   Can continue use of the inhaler every 6 hours as needed  Can try 800 mg of ibuprofen for headache every 8 hours as needed   Can use otc the cough, cold and flu medication for symptom relief    ED Prescriptions    Medication Sig Dispense Auth. Provider   predniSONE (DELTASONE) 20 MG tablet Take 2 tablets (40 mg total) by mouth daily. 10 tablet Valinda Hoar, NP     PDMP not reviewed this encounter.   Valinda Hoar, NP 03/13/21 1524

## 2021-03-24 ENCOUNTER — Other Ambulatory Visit: Payer: Self-pay

## 2021-03-25 NOTE — Progress Notes (Signed)
Chief Complaint  Patient presents with  . PPD Reading    HPI: Patient  Kimberly Henderson  20 y.o. comes in today for  "tb test"   Check for employment for   Health care assistance . Care visit  Last visit video jan and no recent  Preventive visit  Stomach tid per eating. and allergy  Gyne  To be seen . [place]  Allergist  : meds  Gi  transitioning to  Dr Lavon Paganini.  Sleep  8-9 hours   20 - 35 hours  To do [employer].  60 .  Recent respiratory infection with fever that was negative for COVID about 3 to 4 weeks ago has now resolved did have a bad cough. Dec 21 had covid infection and recovered  Past hx neg tb and no known exposure    Health Maintenance  Topic Date Due  . PNEUMOCOCCAL POLYSACCHARIDE VACCINE AGE 70-64 HIGH RISK  Never done  . FOOT EXAM  Never done  . OPHTHALMOLOGY EXAM  Never done  . URINE MICROALBUMIN  Never done  . HIV Screening  Never done  . Hepatitis C Screening  Never done  . HEMOGLOBIN A1C  05/06/2019  . COVID-19 Vaccine (3 - Booster for Pfizer series) 10/13/2020  . INFLUENZA VACCINE  06/19/2021  . TETANUS/TDAP  06/11/2022  . HPV VACCINES  Completed   Health Maintenance Review LIFESTYLE:  Tad-  Sleep ok losing weight  Goals   ROS:  GEN/ HEENT: No fever, significant weight changes sweats headaches vision problems hearing changes, CV/ PULM; No chest pain shortness of breath cough, syncope,edema  change in exercise tolerance. GI /GU: No adominal pain, vomiting, change in bowel habits. No blood in the stool. No significant GU symptoms. SKIN/HEME: ,no acute skin rashes suspicious lesions or bleeding. No lymphadenopathy, nodules, masses.  NEURO/ PSYCH:  No neurologic signs such as weakness numbness. No depression anxiety. IMM/ Allergy: No unusual infections.  Allergy .   REST of 12 system review negative except as per HPI   Past Medical History:  Diagnosis Date  . Asthma   . Eczema    type rash  . History of angioedema   . Obesity   .  Seasonal allergies    singular as needed; worse in cold weather  . Twin birth, mate liveborn     History reviewed. No pertinent surgical history.  Family History  Problem Relation Age of Onset  . Irritable bowel syndrome Mother   . Colitis Mother   . GER disease Mother   . Obesity Sister   . Hypertension Maternal Aunt   . Diabetes Maternal Grandmother   . Hypertension Maternal Grandmother   . Thyroid disease Maternal Grandmother   . Diabetes Paternal Grandmother   . Hypertension Paternal Grandmother     Social History   Socioeconomic History  . Marital status: Single    Spouse name: Not on file  . Number of children: Not on file  . Years of education: Not on file  . Highest education level: Not on file  Occupational History  . Not on file  Tobacco Use  . Smoking status: Never Smoker  . Smokeless tobacco: Never Used  Vaping Use  . Vaping Use: Never used  Substance and Sexual Activity  . Alcohol use: No  . Drug use: No  . Sexual activity: Not Currently  Other Topics Concern  . Not on file  Social History Narrative   Finished HS Feb 2021. Lives with mom  and dad. Working          International aid/development worker of [employer] Strain: Not on [employer] Insecurity: Not on file  Transportation Needs: Not on file  Physical Activity: Not on file  Stress: Not on file  Social Connections: Not on file    Outpatient Medications Prior to Visit  Medication Sig Dispense Refill  . albuterol (PROVENTIL HFA;VENTOLIN HFA) 108 (90 BASE) MCG/ACT inhaler Inhale 2 puffs into the lungs every 6 (six) hours as needed for wheezing or shortness of breath (chest pain). 1 Inhaler 1  . EPINEPHrine (EPIPEN 2-PAK) 0.3 mg/0.3 mL SOAJ injection Inject 0.3 mLs (0.3 mg total) into the muscle once as needed (for severe allergic reaction). CAll 911 immediately if you have to use this medicine 1 Device 0  . levocetirizine (XYZAL) 5 MG tablet SMARTSIG:1 Tablet(s) By Mouth Every Evening    .  predniSONE (DELTASONE) 20 MG tablet Take 2 tablets (40 mg total) by mouth daily. 10 tablet 0  . Spacer/Aero-Holding Chambers (E-Z SPACER) inhaler Use as instructed 1 each 0   No facility-administered medications prior to visit.     EXAM:  BP 96/60 (BP Location: Left Arm, Patient Position: Sitting, Cuff Size: Large)   Pulse 96   Temp 98.1 F (36.7 C) (Oral)   Ht  (1.575 m)   Wt 200 lb 12.8 oz (91.1 kg)   LMP 03/26/2021 (Exact Date)   SpO2 98%   BMI 36.73 kg/m   Body mass index is 36.73 kg/m. Wt Readings from Last 3 Encounters:  03/27/21 200 lb 12.8 oz (91.1 kg)  01/09/21 190 lb 6.4 oz (86.4 kg) (96 %, Z= 1.78)*  11/22/20 193 lb (87.5 kg) (97 %, Z= 1.83)*   * Growth percentiles are based on CDC (Girls, 2-20 Years) data.    Physical Exam: Vital signs reviewed [id number] is a well-developed well-nourished alert cooperative    who appearsr stated age in no acute distress.  HEENT: normocephalic atraumatic , Eyes: PERRL EOM's full, conjunctiva clear,paten,t no , Ears: no deformity EAC's clear TMs with normal landmarks. Mouth: masked  NECK: supple without masses, thyromegaly or bruits. CHEST/PULM:  Clear to auscultation and percussion breath sounds equal no wheeze , rales or rhonchi.  CV: PMI is nondisplaced, S1 S2 no gallops, murmurs, rubs. Peripheral pulses are full .No JVD .  ABDOMEN: Bowel sounds normal nontender  No guard or rebound, no hepato splenomegal no CVA tenderness.  Extremtities:  No clubbing cyanosis or edema, no acute joint swelling or redness no focal atrophy NEURO:  Oriented x3, cranial nerves 3-12 appear to be intact, no obvious focal weakness,gait within normal limits no abnormal reflexes or asymmetrical SKIN: No acute rashes normal turgor, color, no bruising or petechiae.tatoos  PSYCH: Oriented, good eye contact, no obvious depression anxiety, cognition and judgment appear normal. LN: no cervical  adenopathy  Lab Results  Component Value Date   WBC 5.3  09/15/2020   HGB 11.9 (L) 09/15/2020   HCT 34.9 (L) 09/15/2020   PLT 347 09/15/2020   GLUCOSE 86 09/15/2020   CHOL 157 12/06/2014   TRIG 63 12/06/2014   HDL 53 12/06/2014   LDLCALC 91 12/06/2014   ALT 7 04/28/2018   AST 13 04/28/2018   NA 141 09/15/2020   K 3.2 (L) 09/15/2020   CL 104 09/15/2020   CREATININE 0.58 09/15/2020   BUN 6 09/15/2020   CO2 26 09/15/2020   TSH 2.00 03/08/2017   HGBA1C 5.5 11/04/2018  BP Readings from Last 3 Encounters:  03/27/21 96/60  03/13/21 120/76  01/09/21 116/76    ASSESSMENT AND PLAN:  Discussed the following assessment and plan:    ICD-10-CM   1. Visit for preventive health examination  Z00.00   2. Screening-pulmonary TB  Z11.1 TB Skin Test  3. Obesity (BMI 30-39.9)  E66.9   Third healthy BMI goal should continue to try to lose weight Adequate sleep PPD today can fill out forms as needed for work. Return for 48 - 72 hours for  tb reading.  Patient Care Team: Dashanae Longfield, Neta Mends, MD as PCP - General (Internal Medicine) Eileen Stanford, MD as Referring Physician (Allergy and Immunology) Dessa Phi, MD as Consulting Physician (Pediatrics) Verneda Skill, FNP as Nurse Practitioner (Pediatrics) Salem Senate, MD as Consulting Physician (Pediatric Gastroenterology) Patient Instructions     Come back for  Tb testing reading in 48 - 72 hours  *( nurse visit ) and get a copy of results.   Continue attention  lifestyle intervention healthy eating and exercise .  Get enough sleep .     Health Maintenance, Female Adopting a healthy lifestyle and getting preventive care are important in promoting health and wellness. Ask your health care provider about:  The right schedule for you to have regular tests and exams.  Things you can do on your own to prevent diseases and keep yourself healthy. What should I know about diet, weight, and exercise? Eat a healthy diet  Eat a diet that includes plenty of vegetables,  fruits, low-fat dairy products, and lean protein.  Do not eat a lot of foods that are high in solid fats, added sugars, or sodium.   Maintain a healthy weight Body mass index (BMI) is used to identify weight problems. It estimates body fat based on height and weight. Your health care provider can help determine your BMI and help you achieve or maintain a healthy weight. Get regular exercise Get regular exercise. This is one of the most important things you can do for your health. Most adults should:  Exercise for at least 150 minutes each week. The exercise should increase your heart rate and make you sweat (moderate-intensity exercise).  Do strengthening exercises at least twice a week. This is in addition to the moderate-intensity exercise.  Spend less time sitting. Even light physical activity can be beneficial. Watch cholesterol and blood lipids Have your blood tested for lipids and cholesterol at 20 years of age, then have this test every 5 years. Have your cholesterol levels checked more often if:  Your lipid or cholesterol levels are high.  You are older than 20 years of age.  You are at high risk for heart disease. What should I know about cancer screening? Depending on your health history and family history, you may need to have cancer screening at various ages. This may include screening for:  Breast cancer.  Cervical cancer.  Colorectal cancer.  Skin cancer.  Lung cancer. What should I know about heart disease, diabetes, and high blood pressure? Blood pressure and heart disease  High blood pressure causes heart disease and increases the risk of stroke. This is more likely to develop in people who have high blood pressure readings, are of African descent, or are overweight.  Have your blood pressure checked: ? Every 3-5 years if you are 34-54 years of age. ? Every year if you are 50 years old or older. Diabetes Have regular diabetes screenings. This checks your  fasting  blood sugar level. Have the screening done:  Once every three years after age 20 if you are at a normal weight and have a low risk for diabetes.  More often and at a younger age if you are overweight or have a high risk for diabetes. What should I know about preventing infection? Hepatitis B If you have a higher risk for hepatitis B, you should be screened for this virus. Talk with your health care provider to find out if you are at risk for hepatitis B infection. Hepatitis C Testing is recommended for:  Everyone born from [birth year] through 1965.  Anyone with known risk factors for hepatitis C. Sexually transmitted infections (STIs)  Get screened for STIs, including gonorrhea and chlamydia, if: ? You are sexually active and are younger than 20 years of age. ? You are older than 20 years of age and your health care provider tells you that you are at risk for this type of infection. ? Your sexual activity has changed since you were last screened, and you are at increased risk for chlamydia or gonorrhea. Ask your health care provider if you are at risk.  Ask your health care provider about whether you are at high risk for HIV. Your health care provider may recommend a prescription medicine to help prevent HIV infection. If you choose to take medicine to prevent HIV, you should first get tested for HIV. You should then be tested every 3 months for as long as you are taking the medicine. Pregnancy  If you are about to stop having your period (premenopausal) and you may become pregnant, seek counseling before you get pregnant.  Take 400 to 800 micrograms (mcg) of folic acid every day if you become pregnant.  Ask for birth control (contraception) if you want to prevent pregnancy. Osteoporosis and menopause Osteoporosis is a disease in which the bones lose minerals and strength with aging. This can result in bone fractures. If you are 20 years old or older, or if you are at risk for  osteoporosis and fractures, ask your health care provider if you should:  Be screened for bone loss.  Take a calcium or vitamin D supplement to lower your risk of fractures.  Be given hormone replacement therapy (HRT) to treat symptoms of menopause. Follow these instructions at home: Lifestyle  Do not use any products that contain nicotine or tobacco, such as cigarettes, e-cigarettes, and chewing tobacco. If you need help quitting, ask your health care provider.  Do not use street drugs.  Do not share needles.  Ask your health care provider for help if you need support or information about quitting drugs. Alcohol use  Do not drink alcohol if: ? Your health care provider tells you not to drink. ? You are pregnant, may be pregnant, or are planning to become pregnant.  If you drink alcohol: ? Limit how much you use to 0-1 drink a day. ? Limit intake if you are breastfeeding.  Be aware of how much alcohol is in your drink. In the [place] one drink equals one 12 oz bottle of beer (355 mL), one 5 oz glass of wine (148 mL), or one 1 oz glass of hard liquor (44 mL). General instructions  Schedule regular health, dental, and eye exams.  Stay current with your vaccines.  Tell your health care provider if: ? You often feel depressed. ? You have ever been abused or do not feel safe at home. Summary  Adopting a healthy lifestyle and getting  preventive care are important in promoting health and wellness.  Follow your health care provider's instructions about healthy diet, exercising, and getting tested or screened for diseases.  Follow your health care provider's instructions on monitoring your cholesterol and blood pressure. This information is not intended to replace advice given to you by your health care provider. Make sure you discuss any questions you have with your health care provider. Document Revised: 10/29/2018 Document Reviewed: 10/29/2018 Elsevier Patient Education  2021  [employer].    Gordon. Hoang Reich M.D.

## 2021-03-27 ENCOUNTER — Other Ambulatory Visit: Payer: Self-pay

## 2021-03-27 ENCOUNTER — Encounter: Payer: Self-pay | Admitting: Internal Medicine

## 2021-03-27 ENCOUNTER — Ambulatory Visit (INDEPENDENT_AMBULATORY_CARE_PROVIDER_SITE_OTHER): Payer: BC Managed Care – PPO | Admitting: Internal Medicine

## 2021-03-27 VITALS — BP 96/60 | HR 96 | Temp 98.1°F | Ht 62.0 in | Wt 200.8 lb

## 2021-03-27 DIAGNOSIS — E669 Obesity, unspecified: Secondary | ICD-10-CM | POA: Diagnosis not present

## 2021-03-27 DIAGNOSIS — Z111 Encounter for screening for respiratory tuberculosis: Secondary | ICD-10-CM

## 2021-03-27 DIAGNOSIS — Z0000 Encounter for general adult medical examination without abnormal findings: Secondary | ICD-10-CM

## 2021-03-27 NOTE — Patient Instructions (Addendum)
Come back for  Tb testing reading in 48 - 72 hours  *( nurse visit ) and get a copy of results.   Continue attention  lifestyle intervention healthy eating and exercise .  Get enough sleep .     Health Maintenance, Female Adopting a healthy lifestyle and getting preventive care are important in promoting health and wellness. Ask your health care provider about:  The right schedule for you to have regular tests and exams.  Things you can do on your own to prevent diseases and keep yourself healthy. What should I know about diet, weight, and exercise? Eat a healthy diet  Eat a diet that includes plenty of vegetables, fruits, low-fat dairy products, and lean protein.  Do not eat a lot of foods that are high in solid fats, added sugars, or sodium.   Maintain a healthy weight Body mass index (BMI) is used to identify weight problems. It estimates body fat based on height and weight. Your health care provider can help determine your BMI and help you achieve or maintain a healthy weight. Get regular exercise Get regular exercise. This is one of the most important things you can do for your health. Most adults should:  Exercise for at least 150 minutes each week. The exercise should increase your heart rate and make you sweat (moderate-intensity exercise).  Do strengthening exercises at least twice a week. This is in addition to the moderate-intensity exercise.  Spend less time sitting. Even light physical activity can be beneficial. Watch cholesterol and blood lipids Have your blood tested for lipids and cholesterol at 20 years of age, then have this test every 5 years. Have your cholesterol levels checked more often if:  Your lipid or cholesterol levels are high.  You are older than 20 years of age.  You are at high risk for heart disease. What should I know about cancer screening? Depending on your health history and family history, you may need to have cancer screening at  various ages. This may include screening for:  Breast cancer.  Cervical cancer.  Colorectal cancer.  Skin cancer.  Lung cancer. What should I know about heart disease, diabetes, and high blood pressure? Blood pressure and heart disease  High blood pressure causes heart disease and increases the risk of stroke. This is more likely to develop in people who have high blood pressure readings, are of African descent, or are overweight.  Have your blood pressure checked: ? Every 3-5 years if you are 40-14 years of age. ? Every year if you are 106 years old or older. Diabetes Have regular diabetes screenings. This checks your fasting blood sugar level. Have the screening done:  Once every three years after age 13 if you are at a normal weight and have a low risk for diabetes.  More often and at a younger age if you are overweight or have a high risk for diabetes. What should I know about preventing infection? Hepatitis B If you have a higher risk for hepatitis B, you should be screened for this virus. Talk with your health care provider to find out if you are at risk for hepatitis B infection. Hepatitis C Testing is recommended for:  Everyone born from [birth year] through 1965.  Anyone with known risk factors for hepatitis C. Sexually transmitted infections (STIs)  Get screened for STIs, including gonorrhea and chlamydia, if: ? You are sexually active and are younger than 20 years of age. ? You are older than 20 years  of age and your health care provider tells you that you are at risk for this type of infection. ? Your sexual activity has changed since you were last screened, and you are at increased risk for chlamydia or gonorrhea. Ask your health care provider if you are at risk.  Ask your health care provider about whether you are at high risk for HIV. Your health care provider may recommend a prescription medicine to help prevent HIV infection. If you choose to take medicine to prevent  HIV, you should first get tested for HIV. You should then be tested every 3 months for as long as you are taking the medicine. Pregnancy  If you are about to stop having your period (premenopausal) and you may become pregnant, seek counseling before you get pregnant.  Take 400 to 800 micrograms (mcg) of folic acid every day if you become pregnant.  Ask for birth control (contraception) if you want to prevent pregnancy. Osteoporosis and menopause Osteoporosis is a disease in which the bones lose minerals and strength with aging. This can result in bone fractures. If you are 73 years old or older, or if you are at risk for osteoporosis and fractures, ask your health care provider if you should:  Be screened for bone loss.  Take a calcium or vitamin D supplement to lower your risk of fractures.  Be given hormone replacement therapy (HRT) to treat symptoms of menopause. Follow these instructions at home: Lifestyle  Do not use any products that contain nicotine or tobacco, such as cigarettes, e-cigarettes, and chewing tobacco. If you need help quitting, ask your health care provider.  Do not use street drugs.  Do not share needles.  Ask your health care provider for help if you need support or information about quitting drugs. Alcohol use  Do not drink alcohol if: ? Your health care provider tells you not to drink. ? You are pregnant, may be pregnant, or are planning to become pregnant.  If you drink alcohol: ? Limit how much you use to 0-1 drink a day. ? Limit intake if you are breastfeeding.  Be aware of how much alcohol is in your drink. In the [place] one drink equals one 12 oz bottle of beer (355 mL), one 5 oz glass of wine (148 mL), or one 1 oz glass of hard liquor (44 mL). General instructions  Schedule regular health, dental, and eye exams.  Stay current with your vaccines.  Tell your health care provider if: ? You often feel depressed. ? You have ever been abused or do  not feel safe at home. Summary  Adopting a healthy lifestyle and getting preventive care are important in promoting health and wellness.  Follow your health care provider's instructions about healthy diet, exercising, and getting tested or screened for diseases.  Follow your health care provider's instructions on monitoring your cholesterol and blood pressure. This information is not intended to replace advice given to you by your health care provider. Make sure you discuss any questions you have with your health care provider. Document Revised: 10/29/2018 Document Reviewed: 10/29/2018 Elsevier Patient Education  2021 [employer].

## 2021-03-29 LAB — TB SKIN TEST
Induration: 0 mm
TB Skin Test: NEGATIVE

## 2021-03-29 NOTE — Progress Notes (Signed)
Negative TB screening test make sure patient has a written copy of this.  For her job.

## 2021-04-20 DIAGNOSIS — J3089 Other allergic rhinitis: Secondary | ICD-10-CM | POA: Diagnosis not present

## 2021-04-20 DIAGNOSIS — T783XXD Angioneurotic edema, subsequent encounter: Secondary | ICD-10-CM | POA: Diagnosis not present

## 2021-04-20 DIAGNOSIS — L501 Idiopathic urticaria: Secondary | ICD-10-CM | POA: Diagnosis not present

## 2021-04-20 DIAGNOSIS — J4520 Mild intermittent asthma, uncomplicated: Secondary | ICD-10-CM | POA: Diagnosis not present

## 2021-07-11 ENCOUNTER — Ambulatory Visit (HOSPITAL_COMMUNITY)
Admission: RE | Admit: 2021-07-11 | Discharge: 2021-07-11 | Disposition: A | Discharge status: Home / Self Care | Payer: BC Managed Care – PPO | Source: Ambulatory Visit | Attending: Urgent Care | Admitting: Urgent Care

## 2021-07-11 ENCOUNTER — Other Ambulatory Visit: Payer: Self-pay

## 2021-07-11 ENCOUNTER — Encounter (HOSPITAL_COMMUNITY): Payer: Self-pay

## 2021-07-11 VITALS — BP 102/64 | HR 91 | Temp 98.8°F | Resp 16

## 2021-07-11 DIAGNOSIS — J069 Acute upper respiratory infection, unspecified: Secondary | ICD-10-CM | POA: Insufficient documentation

## 2021-07-11 DIAGNOSIS — J45909 Unspecified asthma, uncomplicated: Secondary | ICD-10-CM | POA: Diagnosis not present

## 2021-07-11 DIAGNOSIS — Z20822 Contact with and (suspected) exposure to covid-19: Secondary | ICD-10-CM | POA: Diagnosis not present

## 2021-07-11 DIAGNOSIS — J029 Acute pharyngitis, unspecified: Secondary | ICD-10-CM

## 2021-07-11 LAB — SARS CORONAVIRUS 2 (TAT 6-24 HRS): SARS Coronavirus 2: NEGATIVE

## 2021-07-11 LAB — POCT RAPID STREP A, ED / UC: Streptococcus, Group A Screen (Direct): NEGATIVE

## 2021-07-11 MED ORDER — NAPROXEN 375 MG PO TABS: 375.0000 mg | ORAL_TABLET | Freq: Two times a day (BID) | ORAL | 0 refills | Status: AC

## 2021-07-11 MED ORDER — PSEUDOEPHEDRINE HCL 60 MG PO TABS: 60.0000 mg | ORAL_TABLET | Freq: Three times a day (TID) | ORAL | 0 refills | Status: AC | PRN

## 2021-07-11 MED ORDER — CETIRIZINE HCL 10 MG PO TABS: 10.0000 mg | ORAL_TABLET | Freq: Every day | ORAL | 0 refills | Status: AC

## 2021-07-11 NOTE — ED Notes (Signed)
Strep swab in lab 

## 2021-07-11 NOTE — ED Triage Notes (Signed)
Patient has a sore throat, onset Thursday.  Reports difficulty swallowing and soreness to neck.

## 2021-07-11 NOTE — Discharge Instructions (Addendum)

## 2021-07-11 NOTE — ED Provider Notes (Signed)
[facility]   MRN: [id number] DOB: August 12, 2001  Subjective:   Kimberly Henderson is a 20 y.o. female presenting for 4-day history of acute onset throat pain, neck pain, painful swallowing.  She does feel like her sense of taste is changing.  Denies fever, runny or stuffy nose, cough, chest pain, shortness of breath, wheezing, nausea, vomiting, abdominal pain, rashes.  Has used over-the-counter medication with minimal relief.  Patient is COVID vaccinated but no booster.  She does have a history of asthma but has no respiratory symptoms. She is not a smoker.   No current facility-administered medications for this encounter.  Current Outpatient Medications:    albuterol (PROVENTIL HFA;VENTOLIN HFA) 108 (90 BASE) MCG/ACT inhaler, Inhale 2 puffs into the lungs every 6 (six) hours as needed for wheezing or shortness of breath (chest pain)., Disp: 1 Inhaler, Rfl: 1   EPINEPHrine (EPIPEN 2-PAK) 0.3 mg/0.3 mL SOAJ injection, Inject 0.3 mLs (0.3 mg total) into the muscle once as needed (for severe allergic reaction). CAll 911 immediately if you have to use this medicine, Disp: 1 Device, Rfl: 0   levocetirizine (XYZAL) 5 MG tablet, SMARTSIG:1 Tablet(s) By Mouth Every Evening, Disp: , Rfl:    predniSONE (DELTASONE) 20 MG tablet, Take 2 tablets (40 mg total) by mouth daily., Disp: 10 tablet, Rfl: 0   Spacer/Aero-Holding Chambers (E-Z SPACER) inhaler, Use as instructed, Disp: 1 each, Rfl: 0   No Known Allergies  Past Medical History:  Diagnosis Date   Asthma    Eczema    type rash   History of angioedema    Obesity    Seasonal allergies    singular as needed; worse in cold weather   Twin birth, mate liveborn      No past surgical history on file.  Family History  Problem Relation Age of Onset   Irritable bowel syndrome Mother    Colitis Mother    GER disease Mother    Obesity Sister    Hypertension Maternal Aunt    Diabetes Maternal Grandmother    Hypertension Maternal  Grandmother    Thyroid disease Maternal Grandmother    Diabetes Paternal Grandmother    Hypertension Paternal Grandmother     Social History   Tobacco Use   Smoking status: Never   Smokeless tobacco: Never  Vaping Use   Vaping Use: Never used  Substance Use Topics   Alcohol use: No   Drug use: No    ROS   Objective:   Vitals: BP 102/64 (BP Location: Right Arm)   Pulse 91   Temp 98.8 F (37.1 C) (Oral)   Resp 16   LMP 07/11/2021   SpO2 100%   Physical Exam Constitutional:      General: She is not in acute distress.    Appearance: Normal appearance. She is well-developed. She is not ill-appearing, toxic-appearing or diaphoretic.  HENT:     Head: Normocephalic and atraumatic.     Right Ear: Tympanic membrane and ear canal normal. No drainage or tenderness. No middle ear effusion. Tympanic membrane is not erythematous.     Left Ear: Tympanic membrane and ear canal normal. No drainage or tenderness.  No middle ear effusion. Tympanic membrane is not erythematous.     Nose: Nose normal. No congestion or rhinorrhea.     Mouth/Throat:     Mouth: Mucous membranes are moist. No oral lesions.     Pharynx: No pharyngeal swelling, oropharyngeal exudate, posterior oropharyngeal erythema or uvula swelling.  Tonsils: No tonsillar exudate or tonsillar abscesses.  Eyes:     Extraocular Movements: Extraocular movements intact.     Right eye: Normal extraocular motion.     Left eye: Normal extraocular motion.     Conjunctiva/sclera: Conjunctivae normal.     Pupils: Pupils are equal, round, and reactive to light.  Cardiovascular:     Rate and Rhythm: Normal rate and regular rhythm.     Pulses: Normal pulses.     Heart sounds: Normal heart sounds. No murmur heard.   No friction rub. No gallop.  Pulmonary:     Effort: Pulmonary effort is normal. No respiratory distress.     Breath sounds: Normal breath sounds. No stridor. No wheezing, rhonchi or rales.  Musculoskeletal:      Cervical back: Normal range of motion and neck supple.  Lymphadenopathy:     Cervical: No cervical adenopathy.  Skin:    General: Skin is warm and dry.     Findings: No rash.  Neurological:     General: No focal deficit present.     Mental Status: She is alert and oriented to person, place, and time.  Psychiatric:        Mood and Affect: Mood normal.        Behavior: Behavior normal.        Thought Content: Thought content normal.    Results for orders placed or performed during the hospital encounter of 07/11/21 (from the past 24 hour(s))  POCT Rapid Strep A     Status: None   Collection Time: 07/11/21 12:01 PM  Result Value Ref Range   Streptococcus, Group A Screen (Direct) NEGATIVE NEGATIVE    Assessment and Plan :   PDMP not reviewed this encounter.  1. Viral pharyngitis   2. Sore throat     Will manage for viral illness such as viral URI, viral syndrome, viral rhinitis, COVID-19, viral pharyngitis. Counseled patient on nature of COVID-19 including modes of transmission, diagnostic testing, management and supportive care.  Offered scripts for symptomatic relief. COVID 19 and strep culture are pending. Counseled patient on potential for adverse effects with medications prescribed/recommended today, ER and return-to-clinic precautions discussed, patient verbalized understanding.     Wallis Bamberg, PA-C 07/11/21 1326

## 2021-07-13 LAB — CULTURE, GROUP A STREP (THRC)

## 2021-07-28 ENCOUNTER — Telehealth: Payer: Self-pay | Admitting: Internal Medicine

## 2021-07-28 NOTE — Telephone Encounter (Signed)
Pt call and stated she need her # 2 MMR for school and want a call back. Pt stated you can leave her a voice mail message if she don't pick up.

## 2021-07-29 NOTE — Telephone Encounter (Signed)
yes

## 2021-07-31 NOTE — Telephone Encounter (Signed)
I spoke with the pt and she stated that she does not need vaccinations. Pt stated that her sister may need vaccinations. I ATC the patient but could not leave vm due to mailbox being full.

## 2021-09-14 ENCOUNTER — Telehealth: Payer: Self-pay | Admitting: Internal Medicine

## 2021-10-01 ENCOUNTER — Ambulatory Visit (HOSPITAL_COMMUNITY): Payer: BC Managed Care – PPO

## 2021-10-03 NOTE — Telephone Encounter (Signed)
error 

## 2022-03-29 DIAGNOSIS — Z6841 Body Mass Index (BMI) 40.0 and over, adult: Secondary | ICD-10-CM | POA: Diagnosis not present

## 2022-03-29 DIAGNOSIS — Z113 Encounter for screening for infections with a predominantly sexual mode of transmission: Secondary | ICD-10-CM | POA: Diagnosis not present

## 2022-03-29 DIAGNOSIS — Z01419 Encounter for gynecological examination (general) (routine) without abnormal findings: Secondary | ICD-10-CM | POA: Diagnosis not present

## 2022-03-29 DIAGNOSIS — Z319 Encounter for procreative management, unspecified: Secondary | ICD-10-CM | POA: Diagnosis not present

## 2022-03-29 DIAGNOSIS — Z124 Encounter for screening for malignant neoplasm of cervix: Secondary | ICD-10-CM | POA: Diagnosis not present

## 2022-03-29 LAB — HM PAP SMEAR

## 2022-04-05 ENCOUNTER — Encounter: Payer: Self-pay | Admitting: Internal Medicine

## 2022-06-27 DIAGNOSIS — J3089 Other allergic rhinitis: Secondary | ICD-10-CM | POA: Diagnosis not present

## 2022-06-27 DIAGNOSIS — J4520 Mild intermittent asthma, uncomplicated: Secondary | ICD-10-CM | POA: Diagnosis not present

## 2022-06-27 DIAGNOSIS — L501 Idiopathic urticaria: Secondary | ICD-10-CM | POA: Diagnosis not present

## 2022-08-24 DIAGNOSIS — T783XXA Angioneurotic edema, initial encounter: Secondary | ICD-10-CM | POA: Diagnosis not present

## 2022-08-24 DIAGNOSIS — J3089 Other allergic rhinitis: Secondary | ICD-10-CM | POA: Diagnosis not present

## 2022-08-24 DIAGNOSIS — L501 Idiopathic urticaria: Secondary | ICD-10-CM | POA: Diagnosis not present

## 2022-08-24 DIAGNOSIS — J4520 Mild intermittent asthma, uncomplicated: Secondary | ICD-10-CM | POA: Diagnosis not present

## 2023-01-24 ENCOUNTER — Ambulatory Visit
Admission: EM | Admit: 2023-01-24 | Discharge: 2023-01-24 | Disposition: A | Discharge status: Home / Self Care | Payer: BC Managed Care – PPO | Attending: Urgent Care | Admitting: Urgent Care

## 2023-01-24 DIAGNOSIS — J019 Acute sinusitis, unspecified: Secondary | ICD-10-CM

## 2023-01-24 DIAGNOSIS — B9689 Other specified bacterial agents as the cause of diseases classified elsewhere: Secondary | ICD-10-CM | POA: Diagnosis not present

## 2023-01-24 DIAGNOSIS — J45901 Unspecified asthma with (acute) exacerbation: Secondary | ICD-10-CM

## 2023-01-24 MED ORDER — PREDNISONE 20 MG PO TABS: 60.0000 mg | ORAL_TABLET | Freq: Every day | ORAL | 0 refills | Status: AC

## 2023-01-24 MED ORDER — AMOXICILLIN-POT CLAVULANATE 875-125 MG PO TABS: 1.0000 | ORAL_TABLET | Freq: Two times a day (BID) | ORAL | 0 refills | Status: AC

## 2023-01-24 NOTE — Discharge Instructions (Addendum)
Follow up here or with your primary care provider if your symptoms are worsening or not improving with treatment.          

## 2023-01-24 NOTE — ED Triage Notes (Addendum)
Patient presents to UC for sore throat, HA, and cough since 2 weeks. Had covid, symptoms were improving but worsened now. Treating symptoms with dayquil, Nyquil, Theraflu, and robitussin.

## 2023-01-24 NOTE — ED Provider Notes (Signed)
[facility]    CSN: [id number] Arrival date & time: 01/24/23  1643      History   Chief Complaint No chief complaint on file.   HPI Kimberly Henderson is a 22 y.o. female.   HPI  Presents with symptoms of sore throat, HA, cough x 2 weeks.  She endorses recent history of COVID infection and states her symptoms were improving but now worsening.  She has been treating her symptoms with over-the-counter medication.  Endorses worsening asthma symptoms including chest tightness and difficulty breathing.  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  OB History   No obstetric history on file.      Home Medications    Prior to Admission medications   Medication Sig Start Date End Date Taking? Authorizing Provider  albuterol (PROVENTIL HFA;VENTOLIN HFA) 108 (90 BASE) MCG/ACT inhaler Inhale 2 puffs into the lungs every 6 (six) hours as needed for wheezing or shortness of breath (chest pain). 01/07/15   Panosh, Standley Brooking, MD  cetirizine (ZYRTEC ALLERGY) 10 MG tablet Take 1 tablet (10 mg total) by mouth daily. 07/11/21   Jaynee Eagles, PA-C  EPINEPHrine (EPIPEN 2-PAK) 0.3 mg/0.3 mL SOAJ injection Inject 0.3 mLs (0.3 mg total) into the muscle once as needed (for severe allergic reaction). CAll 911 immediately if you have to use this medicine 01/04/14   Panosh, Standley Brooking, MD  levocetirizine  (XYZAL) 5 MG tablet SMARTSIG:1 Tablet(s) By Mouth Every Evening 11/22/20   [provider]  naproxen (NAPROSYN) 375 MG tablet Take 1 tablet (375 mg total) by mouth 2 (two) times daily with a meal. 07/11/21   Jaynee Eagles, PA-C  predniSONE (DELTASONE) 20 MG tablet Take 2 tablets (40 mg total) by mouth daily. Patient not taking: Reported on 07/11/2021 03/13/21   Hans Eden, NP  pseudoephedrine (SUDAFED) 60 MG tablet Take 1 tablet (60 mg total) by mouth every 8 (eight) hours as needed for congestion. 07/11/21   Jaynee Eagles, PA-C  Spacer/Aero-Holding Chambers (E-Z SPACER) inhaler Use as instructed 11/26/18   Caren Macadam, MD    Family History Family History  Problem Relation Age of Onset   Irritable bowel syndrome Mother    Colitis Mother    GER disease Mother    Obesity Sister    Hypertension Maternal Aunt    Diabetes Maternal Grandmother    Hypertension Maternal Grandmother    Thyroid disease Maternal Grandmother    Diabetes Paternal Grandmother    Hypertension Paternal Grandmother     Social History Social History   Tobacco Use   Smoking status: Never   Smokeless tobacco: Never  Vaping Use   Vaping Use: Never used  Substance Use Topics   Alcohol use: No   Drug use: No     Allergies  Patient has no known allergies.   Review of Systems Review of Systems   Physical Exam Triage Vital Signs ED Triage Vitals [01/24/23 1703]  Enc Vitals Group     BP 114/78     Pulse Rate 91     Resp 16     Temp 98.2 F (36.8 C)     Temp Source Oral     SpO2 98 %     Weight      Height      Head Circumference      Peak Flow      Pain Score      Pain Loc      Pain Edu?      Excl. in [place]?    No data found.  Updated Vital Signs BP 114/78 (BP Location: Left Arm)   Pulse 91   Temp 98.2 F (36.8 C) (Oral)   Resp 16   SpO2 98%   Visual Acuity Right Eye Distance:   Left Eye Distance:   Bilateral Distance:    Right Eye Near:   Left Eye Near:    Bilateral  Near:     Physical Exam Vitals reviewed.  Constitutional:      Appearance: Normal appearance. She is ill-appearing.  Cardiovascular:     Rate and Rhythm: Normal rate and regular rhythm.     Pulses: Normal pulses.     Heart sounds: Normal heart sounds.  Pulmonary:     Effort: Pulmonary effort is normal.     Breath sounds: Normal breath sounds.  Skin:    General: Skin is warm and dry.  Neurological:     General: No focal deficit present.     Mental Status: She is alert and oriented to person, place, and time.  Psychiatric:        Mood and Affect: Mood normal.        Behavior: Behavior normal.      UC Treatments / Results  Labs (all labs ordered are listed, but only abnormal results are displayed) Labs Reviewed - No data to display  EKG   Radiology No results found.  Procedures Procedures (including critical care time)  Medications Ordered in UC Medications - No data to display  Initial Impression / Assessment and Plan / UC Course  I have reviewed the triage vital signs and the nursing notes.  Pertinent labs & imaging results that were available during my care of the patient were reviewed by me and considered in my medical decision making (see chart for details).   Patient is afebrile here without recent antipyretics. Satting well on room air. Overall is well appearing, well hydrated, without respiratory distress. Pulmonary exam is unremarkable.  Lungs CTAB without wheezing, rhonchi, rales.  Patient's symptoms are consistent with exacerbation of asthma.  Given her continued URI symptoms, suspect acute bacterial sinusitis secondary to past viral infection.  Will treat her with antibiotic therapy as well as a course of prednisone to help control the asthma.  Otherwise continue to recommend use of OTC medication for symptom control.  Final Clinical Impressions(s) / UC Diagnoses   Final diagnoses:  None   Discharge Instructions   None    ED Prescriptions   None     PDMP not reviewed this encounter.   Rose Phi, [place] 01/24/23 1736

## 2023-01-31 ENCOUNTER — Ambulatory Visit
Admission: EM | Admit: 2023-01-31 | Discharge: 2023-01-31 | Disposition: A | Discharge status: Home / Self Care | Payer: BC Managed Care – PPO

## 2023-01-31 DIAGNOSIS — J302 Other seasonal allergic rhinitis: Secondary | ICD-10-CM | POA: Diagnosis not present

## 2023-01-31 MED ORDER — FLUTICASONE PROPIONATE 50 MCG/ACT NA SUSP: 1.0000 | Freq: Every day | NASAL | 0 refills | Status: AC

## 2023-01-31 NOTE — ED Provider Notes (Signed)
[facility]    CSN: [id number] Arrival date & time: 01/31/23  1756      History   Chief Complaint Chief Complaint  Patient presents with   Sore Throat    HPI Kimberly Henderson is a 22 y.o. female.  Patient presents with 3 day history of sore throat. She also reports postnasal drip for 2 weeks.  Patient denies fever, chills, rash, ear pain, wheezing, shortness of breath, or other symptoms.  No OTC medications taken today.  She is currently on Augmentin.    Patient was seen at this urgent care on 01/24/2023; diagnosed with asthma exacerbation and sinusitis; treated with Augmentin and prednisone.  Her medical history includes asthma, allergies, eczema, obesity.  The history is provided by the patient and medical records.    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 No pertinent surgical history.  OB History   No obstetric history on file.      Home Medications    Prior to Admission medications   Medication Sig Start Date End Date Taking? Authorizing Provider  fluticasone (FLONASE) 50 MCG/ACT nasal spray Place 1 spray into both nostrils daily. 01/31/23  Yes Sharion Balloon, NP  albuterol (PROAIR HFA) 108 (90 Base) MCG/ACT inhaler 2 puffs as needed Inhalation every 4-6hrs prn for 90    [provider]  albuterol (PROVENTIL HFA;VENTOLIN HFA) 108 (90 BASE) MCG/ACT inhaler Inhale 2  puffs into the lungs every 6 (six) hours as needed for wheezing or shortness of breath (chest pain). 01/07/15   Panosh, Standley Brooking, MD  amoxicillin-clavulanate (AUGMENTIN) 875-125 MG tablet Take 1 tablet by mouth every 12 (twelve) hours. 01/24/23   Immordino, Annie Main, FNP  cetirizine (ZYRTEC ALLERGY) 10 MG tablet Take 1 tablet (10 mg total) by mouth daily. 07/11/21   Jaynee Eagles, PA-C  EPINEPHrine (EPIPEN 2-PAK) 0.3 mg/0.3 mL SOAJ injection Inject 0.3 mLs (0.3 mg total) into the muscle once as needed (for severe allergic reaction). CAll 911 immediately if you have to use this medicine 01/04/14   Panosh, Standley Brooking, MD  levocetirizine (XYZAL) 5 MG tablet SMARTSIG:1 Tablet(s) By Mouth Every Evening 11/22/20   [provider]  naproxen (NAPROSYN) 375 MG tablet Take 1 tablet (375 mg total) by mouth 2 (two) times daily with a meal. 07/11/21   Jaynee Eagles, PA-C  pseudoephedrine (SUDAFED) 60 MG tablet Take 1 tablet (60 mg total) by mouth every 8 (eight) hours as needed for congestion. Patient not taking: Reported on 01/31/2023 07/11/21   Jaynee Eagles, PA-C  Spacer/Aero-Holding Chambers (E-Z SPACER) inhaler Use as instructed 11/26/18   Caren Macadam, MD    Family History Family History  Problem Relation Age of Onset   Irritable bowel syndrome Mother    Colitis Mother    GER disease Mother    Obesity Sister  Hypertension Maternal Aunt    Diabetes Maternal Grandmother    Hypertension Maternal Grandmother    Thyroid disease Maternal Grandmother    Diabetes Paternal Grandmother    Hypertension Paternal Grandmother     Social History Social History   Tobacco Use   Smoking status: Never   Smokeless tobacco: Never  Vaping Use   Vaping Use: Never used  Substance Use Topics   Alcohol use: Yes    Comment: social drinker   Drug use: No     Allergies   Ibuprofen   Review of Systems Review of Systems  Constitutional:  Negative for chills and fever.  HENT:  Positive for postnasal drip and  sore throat. Negative for ear pain.   Respiratory:  Negative for cough, shortness of breath and wheezing.   Gastrointestinal:  Negative for diarrhea and vomiting.  Skin:  Negative for rash.  All other systems reviewed and are negative.    Physical Exam Triage Vital Signs ED Triage Vitals  Enc Vitals Group     BP      Pulse      Resp      Temp      Temp src      SpO2      Weight      Height      Head Circumference      Peak Flow      Pain Score      Pain Loc      Pain Edu?      Excl. in [place]?    No data found.  Updated Vital Signs BP 119/75   Pulse 97   Temp 98.5 F (36.9 C)   Resp 18   Ht 5' 2.5" (1.588 m)   Wt 210 lb (95.3 kg)   LMP 01/24/2023   SpO2 97%   BMI 37.80 kg/m   Visual Acuity Right Eye Distance:   Left Eye Distance:   Bilateral Distance:    Right Eye Near:   Left Eye Near:    Bilateral Near:     Physical Exam Vitals and nursing note reviewed.  Constitutional:      General: She is not in acute distress.    Appearance: Normal appearance. She is well-developed. She is not ill-appearing.  HENT:     Right Ear: Tympanic membrane normal.     Left Ear: Tympanic membrane normal.     Nose: Nose normal.     Mouth/Throat:     Mouth: Mucous membranes are moist.     Pharynx: Oropharynx is clear.     Comments: Clear PND. Cardiovascular:     Rate and Rhythm: Normal rate and regular rhythm.     Heart sounds: Normal heart sounds.  Pulmonary:     Effort: Pulmonary effort is normal. No respiratory distress.     Breath sounds: Normal breath sounds. No wheezing.  Musculoskeletal:     Cervical back: Neck supple.  Skin:    General: Skin is warm and dry.  Neurological:     Mental Status: She is alert.  Psychiatric:        Mood and Affect: Mood normal.        Behavior: Behavior normal.      UC Treatments / Results  Labs (all labs ordered are listed, but only abnormal results are displayed) Labs Reviewed - No data to  display  EKG   Radiology No results found.  Procedures Procedures (including critical care time)  Medications Ordered in UC Medications -  No data to display  Initial Impression / Assessment and Plan / UC Course  I have reviewed the triage vital signs and the nursing notes.  Pertinent labs & imaging results that were available during my care of the patient were reviewed by me and considered in my medical decision making (see chart for details).    Seasonal allergies.  Patient is currently on Augmentin.  She has history of allergies and takes Xyzal daily.  Treating today with Flonase nasal spray.  Instructed patient to continue current medications and to follow up with her PCP if her symptoms are not improving.  Education provided on allergic rhinitis.  She agrees to plan of care.    Final Clinical Impressions(s) / UC Diagnoses   Final diagnoses:  Seasonal allergies     Discharge Instructions      Continue your current medications.  Use the Flonase nasal spray as directed.  Follow up with your primary care provider if your symptoms are not improving.        ED Prescriptions     Medication Sig Dispense Auth. Provider   fluticasone (FLONASE) 50 MCG/ACT nasal spray Place 1 spray into both nostrils daily. 16 g Sharion Balloon, NP      PDMP not reviewed this encounter.   Sharion Balloon, NP 01/31/23 [phone]

## 2023-01-31 NOTE — ED Triage Notes (Signed)
Patient to Urgent Care with complaints of sore throat. Reports her sore throat started three days but has been having a lot of post-nasal drip for several weeks.  Recently treated w/ augmentin and prednisone prescribed 3/7. States she has also been taking mucinex.  Denies any recent fevers.

## 2023-01-31 NOTE — Discharge Instructions (Addendum)
Continue your current medications.  Use the Flonase nasal spray as directed.  Follow up with your primary care provider if your symptoms are not improving.

## 2023-09-21 ENCOUNTER — Ambulatory Visit
Admission: EM | Admit: 2023-09-21 | Discharge: 2023-09-21 | Disposition: A | Discharge status: Home / Self Care | Payer: BC Managed Care – PPO | Attending: Emergency Medicine | Admitting: Emergency Medicine

## 2023-09-21 DIAGNOSIS — J069 Acute upper respiratory infection, unspecified: Secondary | ICD-10-CM | POA: Diagnosis not present

## 2023-09-21 DIAGNOSIS — Z8709 Personal history of other diseases of the respiratory system: Secondary | ICD-10-CM

## 2023-09-21 LAB — POC COVID19/FLU A&B COMBO
Covid Antigen, POC: NEGATIVE
Influenza A Antigen, POC: NEGATIVE
Influenza B Antigen, POC: NEGATIVE

## 2023-09-21 MED ORDER — ALBUTEROL SULFATE HFA 108 (90 BASE) MCG/ACT IN AERS: 1.0000 | INHALATION_SPRAY | Freq: Four times a day (QID) | RESPIRATORY_TRACT | 0 refills | Status: AC | PRN

## 2023-09-21 NOTE — ED Triage Notes (Signed)
Patient to Urgent Care with complaints of frontal headache/ sinus pain. Productive cough w/ back pain. Nausea. Weakness. Symptoms started three days ago. Denies any known fevers.   Reports several of her coworkers currently w/ Covid.   Taking tylenol.

## 2023-09-21 NOTE — ED Provider Notes (Signed)
Renaldo Fiddler    CSN: [id number] Arrival date & time: 09/21/23  1345      History   Chief Complaint Chief Complaint  Patient presents with   Cough    HPI CHAELA BRANSCUM is a 22 y.o. female.  Patient presents with 3-day history of congestion, sinus pressure, cough, back pain, nausea, fatigue.  Treating symptoms with Tylenol; last taken yesterday; no OTC medications today.  No fever, wheezing, shortness of breath, vomiting, diarrhea, or other symptoms.  She reports exposure to COVID at work.  Her medical history includes exercise-induced asthma, prediabetes, obesity, allergies.  Patient does not currently have an albuterol inhaler (expired).  The history is provided by the patient and medical records.    Past Medical History:  Diagnosis Date   Asthma    Eczema    type rash   History of angioedema    Obesity    Seasonal allergies    singular as needed; worse in cold weather   Twin birth, mate liveborn     Patient Active Problem List   Diagnosis Date Noted   Oligomenorrhea 09/04/2016   Wears glasses 11/22/2015   Prolonged periods 11/22/2015   Well adolescent visit 07/29/2014   Dysmenorrhea 07/29/2014   Facial rash 01/05/2014   BMI, pediatric > 99% for age 61/22/2014   Prediabetes 01/31/2012   Dyspepsia 01/31/2012   Exercise-induced asthma 09/24/2011   Acquired acanthosis nigricans 09/24/2011    History reviewed. No pertinent surgical history.  OB History   No obstetric history on file.      Home Medications    Prior to Admission medications   Medication Sig Start Date End Date Taking? Authorizing Provider  albuterol (VENTOLIN HFA) 108 (90 Base) MCG/ACT inhaler Inhale 1-2 puffs into the lungs every 6 (six) hours as needed. 09/21/23  Yes Mickie Bail, NP  amoxicillin-clavulanate (AUGMENTIN) 875-125 MG tablet Take 1 tablet by mouth every 12 (twelve) hours. Patient not taking: Reported on 09/21/2023 01/24/23   Immordino, Jeannett Senior, FNP  cetirizine (ZYRTEC  ALLERGY) 10 MG tablet Take 1 tablet (10 mg total) by mouth daily. 07/11/21   Wallis Bamberg, PA-C  EPINEPHrine (EPIPEN 2-PAK) 0.3 mg/0.3 mL SOAJ injection Inject 0.3 mLs (0.3 mg total) into the muscle once as needed (for severe allergic reaction). CAll 911 immediately if you have to use this medicine 01/04/14   Panosh, Neta Mends, MD  fluticasone (FLONASE) 50 MCG/ACT nasal spray Place 1 spray into both nostrils daily. 01/31/23   Mickie Bail, NP  levocetirizine (XYZAL) 5 MG tablet SMARTSIG:1 Tablet(s) By Mouth Every Evening 11/22/20   [provider]  naproxen (NAPROSYN) 375 MG tablet Take 1 tablet (375 mg total) by mouth 2 (two) times daily with a meal. 07/11/21   Wallis Bamberg, PA-C  pseudoephedrine (SUDAFED) 60 MG tablet Take 1 tablet (60 mg total) by mouth every 8 (eight) hours as needed for congestion. Patient not taking: Reported on 01/31/2023 07/11/21   Wallis Bamberg, PA-C  Spacer/Aero-Holding Chambers (E-Z SPACER) inhaler Use as instructed 11/26/18   Wynn Banker, MD    Family History Family History  Problem Relation Age of Onset   Irritable bowel syndrome Mother    Colitis Mother    GER disease Mother    Obesity Sister    Hypertension Maternal Aunt    Diabetes Maternal Grandmother    Hypertension Maternal Grandmother    Thyroid disease Maternal Grandmother    Diabetes Paternal Grandmother    Hypertension Paternal Grandmother  Social History Social History   Tobacco Use   Smoking status: Never   Smokeless tobacco: Never  Vaping Use   Vaping status: Never Used  Substance Use Topics   Alcohol use: Yes    Comment: social drinker   Drug use: No     Allergies   Ibuprofen   Review of Systems Review of Systems  Constitutional:  Positive for fatigue. Negative for chills and fever.  HENT:  Positive for congestion and sinus pressure. Negative for ear pain and sore throat.   Respiratory:  Positive for cough. Negative for shortness of breath and wheezing.    Gastrointestinal:  Positive for nausea. Negative for abdominal pain, diarrhea and vomiting.  Musculoskeletal:  Positive for back pain.     Physical Exam Triage Vital Signs ED Triage Vitals  Encounter Vitals Group     BP 09/21/23 1414 112/77     Systolic BP Percentile --      Diastolic BP Percentile --      Pulse Rate 09/21/23 1414 94     Resp 09/21/23 1414 18     Temp 09/21/23 1414 98.3 F (36.8 C)     Temp src --      SpO2 09/21/23 1414 95 %     Weight --      Height --      Head Circumference --      Peak Flow --      Pain Score 09/21/23 1406 8     Pain Loc --      Pain Education --      Exclude from Growth Chart --    No data found.  Updated Vital Signs BP 112/77   Pulse 94   Temp 98.3 F (36.8 C)   Resp 18   LMP 09/11/2023   SpO2 95%   Visual Acuity Right Eye Distance:   Left Eye Distance:   Bilateral Distance:    Right Eye Near:   Left Eye Near:    Bilateral Near:     Physical Exam Constitutional:      General: She is not in acute distress. HENT:     Right Ear: Tympanic membrane normal.     Left Ear: Tympanic membrane normal.     Nose: Nose normal.     Mouth/Throat:     Mouth: Mucous membranes are moist.     Pharynx: Oropharynx is clear.  Cardiovascular:     Rate and Rhythm: Normal rate and regular rhythm.     Heart sounds: Normal heart sounds.  Pulmonary:     Effort: Pulmonary effort is normal. No respiratory distress.     Breath sounds: Normal breath sounds. No wheezing.  Skin:    General: Skin is warm and dry.  Neurological:     Mental Status: She is alert.      UC Treatments / Results  Labs (all labs ordered are listed, but only abnormal results are displayed) Labs Reviewed  POC COVID19/FLU A&B COMBO    EKG   Radiology No results found.  Procedures Procedures (including critical care time)  Medications Ordered in UC Medications - No data to display  Initial Impression / Assessment and Plan / UC Course  I have  reviewed the triage vital signs and the nursing notes.  Pertinent labs & imaging results that were available during my care of the patient were reviewed by me and considered in my medical decision making (see chart for details).    Viral URI, history of asthma.  Afebrile and vital signs are stable.  Lungs are clear and O2 sat is 95% on room air.  Rapid COVID and flu negative.  Patient does not currently have an albuterol inhaler and she has a history of asthma.  Albuterol inhaler sent to her pharmacy.  Discussed symptomatic treatment including Tylenol and plain Mucinex.  Instructed her to follow-up with her PCP if she is not improving.  She agrees to plan of care.  Final Clinical Impressions(s) / UC Diagnoses   Final diagnoses:  Viral URI  History of asthma     Discharge Instructions      Your COVID and flu tests are negative.    Take Tylenol as needed for fever or discomfort.  Take plain Mucinex as needed for congestion.  Use the albuterol inhaler as directed.    Follow-up with your primary care provider if your symptoms are not improving.      ED Prescriptions     Medication Sig Dispense Auth. Provider   albuterol (VENTOLIN HFA) 108 (90 Base) MCG/ACT inhaler Inhale 1-2 puffs into the lungs every 6 (six) hours as needed. 17 g Mickie Bail, NP      PDMP not reviewed this encounter.   Mickie Bail, NP 09/21/23 [phone]

## 2023-09-21 NOTE — Discharge Instructions (Signed)
Your COVID and flu tests are negative.    Take Tylenol as needed for fever or discomfort.  Take plain Mucinex as needed for congestion.  Use the albuterol inhaler as directed.    Follow-up with your primary care provider if your symptoms are not improving.

## 2023-11-27 ENCOUNTER — Telehealth: Payer: Self-pay | Admitting: Internal Medicine

## 2023-11-27 NOTE — Telephone Encounter (Signed)
 Called pt to sch physical, left vm. Pls sch pt for appt.

## 2023-12-26 ENCOUNTER — Encounter: Payer: Self-pay | Admitting: Emergency Medicine

## 2023-12-26 ENCOUNTER — Ambulatory Visit
Admission: EM | Admit: 2023-12-26 | Discharge: 2023-12-26 | Disposition: A | Discharge status: Home / Self Care | Payer: BC Managed Care – PPO | Attending: Emergency Medicine | Admitting: Emergency Medicine

## 2023-12-26 DIAGNOSIS — J4521 Mild intermittent asthma with (acute) exacerbation: Secondary | ICD-10-CM

## 2023-12-26 DIAGNOSIS — J101 Influenza due to other identified influenza virus with other respiratory manifestations: Secondary | ICD-10-CM

## 2023-12-26 LAB — POC COVID19/FLU A&B COMBO
Covid Antigen, POC: NEGATIVE
Influenza A Antigen, POC: POSITIVE — AB
Influenza B Antigen, POC: NEGATIVE

## 2023-12-26 MED ORDER — OSELTAMIVIR PHOSPHATE 75 MG PO CAPS: 75.0000 mg | ORAL_CAPSULE | Freq: Two times a day (BID) | ORAL | 0 refills | Status: AC

## 2023-12-26 MED ORDER — DEXAMETHASONE SODIUM PHOSPHATE 10 MG/ML IJ SOLN
10.0000 mg | Freq: Once | INTRAMUSCULAR | Status: AC
  Administered 2023-12-26: 10 mg via INTRAMUSCULAR

## 2023-12-26 MED ORDER — BENZONATATE 100 MG PO CAPS: 100.0000 mg | ORAL_CAPSULE | Freq: Three times a day (TID) | ORAL | 0 refills | Status: AC

## 2023-12-26 MED ORDER — ALBUTEROL SULFATE (2.5 MG/3ML) 0.083% IN NEBU: 2.5000 mg | INHALATION_SOLUTION | Freq: Four times a day (QID) | RESPIRATORY_TRACT | 1 refills | Status: AC | PRN

## 2023-12-26 MED ORDER — PREDNISONE 10 MG (21) PO TBPK: ORAL_TABLET | Freq: Every day | ORAL | 0 refills | Status: DC

## 2023-12-26 MED ORDER — PROMETHAZINE-DM 6.25-15 MG/5ML PO SYRP: 5.0000 mL | ORAL_SOLUTION | Freq: Every evening | ORAL | 0 refills | Status: AC | PRN

## 2023-12-26 NOTE — Discharge Instructions (Addendum)
 Influenza A is a virus and should steadily improve in time it can take up to 7 to 10 days before you truly start to see a turnaround however things will get better  Begin Tamiflu  every morning and every evening for 5 days to reduce the amount of virus in the body which helps to minimize symptoms  You have been given an injection of Decadron  as a steroid to open and relax the airway, should see improvement within the hour  Starting tomorrow take prednisone  every morning with food as directed   You have been given a nebulizer machine may use this for your inhaler as needed  May use Tessalon  pill every 8 hours for cough, may cough syrup at bedtime to rest    You can take Tylenol  and/or Ibuprofen  as needed for fever reduction and pain relief.   For cough: honey 1/2 to 1 teaspoon (you can dilute the honey in water or another fluid).  You can also use guaifenesin and dextromethorphan for cough. You can use a humidifier for chest congestion and cough.  If you don't have a humidifier, you can sit in the bathroom with the hot shower running.      For sore throat: try warm salt water gargles, cepacol lozenges, throat spray, warm tea or water with lemon/honey, popsicles or ice, or OTC cold relief medicine for throat discomfort.   For congestion: take a daily anti-histamine like Zyrtec , Claritin, and a oral decongestant, such as pseudoephedrine .  You can also use Flonase  1-2 sprays in each nostril daily.   It is important to stay hydrated: drink plenty of fluids (water, gatorade/powerade/pedialyte, juices, or teas) to keep your throat moisturized and help further relieve irritation/discomfort.

## 2023-12-26 NOTE — ED Triage Notes (Signed)
 Pt c/o chills, congestion, SOB, and diarrhea x 2-3 days.

## 2023-12-26 NOTE — ED Provider Notes (Signed)
 CAY RALPH PELT    CSN: [id number] Arrival date & time: 12/26/23  1247      History   Chief Complaint Chief Complaint  Patient presents with   Chills   Cough   Nasal Congestion   Diarrhea    HPI Kimberly Henderson is a 23 y.o. female.   Patient presents for evaluation of chills, night sweats, nasal congestion and a nonproductive cough, intermittent headaches, chest tightness, shortness of breath with coughing and wheezing present for 1 day.  Close.  Has attempted use of albuterol  inhaler and Claritin.  History of asthma.  Past Medical History:  Diagnosis Date   Asthma    Eczema    type rash   History of angioedema    Obesity    Seasonal allergies    singular as needed; worse in cold weather   Twin birth, mate liveborn     Patient Active Problem List   Diagnosis Date Noted   Oligomenorrhea 09/04/2016   Wears glasses 11/22/2015   Prolonged periods 11/22/2015   Well adolescent visit 07/29/2014   Dysmenorrhea 07/29/2014   Facial rash 01/05/2014   BMI, pediatric > 99% for age 73/22/2014   Prediabetes 01/31/2012   Dyspepsia 01/31/2012   Exercise-induced asthma 09/24/2011   Acquired acanthosis nigricans 09/24/2011    History reviewed. No pertinent surgical history.  OB History   No obstetric history on file.      Home Medications    Prior to Admission medications   Medication Sig Start Date End Date Taking? Authorizing Provider  albuterol  (PROVENTIL ) (2.5 MG/3ML) 0.083% nebulizer solution Take 3 mLs (2.5 mg total) by nebulization every 6 (six) hours as needed for wheezing or shortness of breath. 12/26/23  Yes Doryan Bahl R, NP  benzonatate  (TESSALON ) 100 MG capsule Take 1 capsule (100 mg total) by mouth every 8 (eight) hours. 12/26/23  Yes Brenisha Tsui R, NP  oseltamivir  (TAMIFLU ) 75 MG capsule Take 1 capsule (75 mg total) by mouth every 12 (twelve) hours. 12/26/23  Yes Larone Kliethermes R, NP  predniSONE  (STERAPRED UNI-PAK 21 TAB) 10 MG (21) TBPK tablet  Take by mouth daily. Take 6 tabs by mouth daily  for 1 days, then 5 tabs for 1 days, then 4 tabs for 1 days, then 3 tabs for 1 days, 2 tabs for 1 days, then 1 tab by mouth daily for 1 days 12/26/23  Yes Doninique Lwin R, NP  promethazine -dextromethorphan (PROMETHAZINE -DM) 6.25-15 MG/5ML syrup Take 5 mLs by mouth at bedtime as needed. 12/26/23  Yes Apollo Timothy R, NP  albuterol  (VENTOLIN  HFA) 108 (90 Base) MCG/ACT inhaler Inhale 1-2 puffs into the lungs every 6 (six) hours as needed. 09/21/23  Yes Corlis Burnard DEL, NP  amoxicillin -clavulanate (AUGMENTIN ) 875-125 MG tablet Take 1 tablet by mouth every 12 (twelve) hours. Patient not taking: Reported on 09/21/2023 01/24/23   Immordino, Garnette, FNP  cetirizine  (ZYRTEC  ALLERGY ) 10 MG tablet Take 1 tablet (10 mg total) by mouth daily. 07/11/21   Christopher Savannah, PA-C  EPINEPHrine  (EPIPEN  2-PAK) 0.3 mg/0.3 mL SOAJ injection Inject 0.3 mLs (0.3 mg total) into the muscle once as needed (for severe allergic reaction). CAll 911 immediately if you have to use this medicine 01/04/14   Panosh, Wanda K, MD  fluticasone  (FLONASE ) 50 MCG/ACT nasal spray Place 1 spray into both nostrils daily. 01/31/23  Yes Corlis Burnard DEL, NP  levocetirizine (XYZAL) 5 MG tablet SMARTSIG:1 Tablet(s) By Mouth Every Evening 11/22/20   [provider]  naproxen  (NAPROSYN ) 375 MG  tablet Take 1 tablet (375 mg total) by mouth 2 (two) times daily with a meal. 07/11/21   Christopher Savannah, PA-C  pseudoephedrine  (SUDAFED) 60 MG tablet Take 1 tablet (60 mg total) by mouth every 8 (eight) hours as needed for congestion. Patient not taking: Reported on 01/31/2023 07/11/21   Christopher Savannah, PA-C  Spacer/Aero-Holding Chambers (E-Z SPACER) inhaler Use as instructed 11/26/18   Koberlein, Junell C, MD    Family History Family History  Problem Relation Age of Onset   Irritable bowel syndrome Mother    Colitis Mother    GER disease Mother    Obesity Sister    Hypertension Maternal Aunt    Diabetes Maternal Grandmother     Hypertension Maternal Grandmother    Thyroid  disease Maternal Grandmother    Diabetes Paternal Grandmother    Hypertension Paternal Grandmother     Social History Social History   Tobacco Use   Smoking status: Never   Smokeless tobacco: Never  Vaping Use   Vaping status: Never Used  Substance Use Topics   Alcohol use: Yes    Comment: social drinker   Drug use: No     Allergies   Ibuprofen    Review of Systems Review of Systems   Physical Exam Triage Vital Signs ED Triage Vitals  Encounter Vitals Group     BP 12/26/23 1348 111/75     Systolic BP Percentile --      Diastolic BP Percentile --      Pulse Rate 12/26/23 1348 (!) 122     Resp 12/26/23 1348 16     Temp 12/26/23 1348 99.7 F (37.6 C)     Temp Source 12/26/23 1348 Oral     SpO2 12/26/23 1348 96 %     Weight --      Height --      Head Circumference --      Peak Flow --      Pain Score 12/26/23 1345 8     Pain Loc --      Pain Education --      Exclude from Growth Chart --    No data found.  Updated Vital Signs BP 111/75 (BP Location: Right Arm)   Pulse (!) 122   Temp 99.7 F (37.6 C) (Oral)   Resp 16   LMP 12/02/2023   SpO2 96%   Visual Acuity Right Eye Distance:   Left Eye Distance:   Bilateral Distance:    Right Eye Near:   Left Eye Near:    Bilateral Near:     Physical Exam Constitutional:      Appearance: Normal appearance.  HENT:     Right Ear: Tympanic membrane, ear canal and external ear normal.     Left Ear: Tympanic membrane, ear canal and external ear normal.     Nose: Congestion present. No rhinorrhea.     Mouth/Throat:     Mouth: Mucous membranes are moist.     Pharynx: Oropharynx is clear. Posterior oropharyngeal erythema present. No oropharyngeal exudate.  Eyes:     Extraocular Movements: Extraocular movements intact.  Cardiovascular:     Rate and Rhythm: Normal rate and regular rhythm.     Pulses: Normal pulses.     Heart sounds: Normal heart sounds.   Pulmonary:     Effort: Pulmonary effort is normal.     Breath sounds: Normal breath sounds.  Neurological:     Mental Status: She is alert and oriented to person, place, and time. Mental  status is at baseline.      UC Treatments / Results  Labs (all labs ordered are listed, but only abnormal results are displayed) Labs Reviewed  POC COVID19/FLU A&B COMBO - Abnormal; Notable for the following components:      Result Value   Influenza A Antigen, POC Positive (*)    All other components within normal limits    EKG   Radiology No results found.  Procedures Procedures (including critical care time)  Medications Ordered in UC Medications  dexamethasone  (DECADRON ) injection 10 mg (10 mg Intramuscular Given 12/26/23 1428)    Initial Impression / Assessment and Plan / UC Course  I have reviewed the triage vital signs and the nursing notes.  Pertinent labs & imaging results that were available during my care of the patient were reviewed by me and considered in my medical decision making (see chart for details).  Influenza A, mild intermittent asthma with acute exacerbation   Patient is in no signs of distress nor toxic appearing.  Vital signs are stable.  Low suspicion for pneumonia, pneumothorax or bronchitis and therefore will defer imaging.  Viral etiology most likely flaring asthma.  Decadron  IM given and prescribed prednisone  for home, additionally albuterol  nebulizer and given the machine, Tamiflu , tessalon  and promethazine  DM. May use additional over-the-counter medications as needed for supportive care.  May follow-up with urgent care as needed if symptoms persist or worsen.  Note given.   Final Clinical Impressions(s) / UC Diagnoses   Final diagnoses:  Influenza A  Mild intermittent asthma with acute exacerbation     Discharge Instructions      Influenza A is a virus and should steadily improve in time it can take up to 7 to 10 days before you truly start to see  a turnaround however things will get better  Begin Tamiflu  every morning and every evening for 5 days to reduce the amount of virus in the body which helps to minimize symptoms  You have been given an injection of Decadron  as a steroid to open and relax the airway, should see improvement within the hour  Starting tomorrow take prednisone  every morning with food as directed   You have been given a nebulizer machine may use this for your inhaler as needed  May use Tessalon  pill every 8 hours for cough, may cough syrup at bedtime to rest    You can take Tylenol  and/or Ibuprofen  as needed for fever reduction and pain relief.   For cough: honey 1/2 to 1 teaspoon (you can dilute the honey in water or another fluid).  You can also use guaifenesin and dextromethorphan for cough. You can use a humidifier for chest congestion and cough.  If you don't have a humidifier, you can sit in the bathroom with the hot shower running.      For sore throat: try warm salt water gargles, cepacol lozenges, throat spray, warm tea or water with lemon/honey, popsicles or ice, or OTC cold relief medicine for throat discomfort.   For congestion: take a daily anti-histamine like Zyrtec , Claritin, and a oral decongestant, such as pseudoephedrine .  You can also use Flonase  1-2 sprays in each nostril daily.   It is important to stay hydrated: drink plenty of fluids (water, gatorade/powerade/pedialyte, juices, or teas) to keep your throat moisturized and help further relieve irritation/discomfort.    ED Prescriptions     Medication Sig Dispense Auth. Provider   predniSONE  (STERAPRED UNI-PAK 21 TAB) 10 MG (21) TBPK tablet Take by  mouth daily. Take 6 tabs by mouth daily  for 1 days, then 5 tabs for 1 days, then 4 tabs for 1 days, then 3 tabs for 1 days, 2 tabs for 1 days, then 1 tab by mouth daily for 1 days 21 tablet Hermenia Fritcher R, NP   benzonatate  (TESSALON ) 100 MG capsule Take 1 capsule (100 mg total) by mouth every 8  (eight) hours. 21 capsule Elad Macphail R, NP   promethazine -dextromethorphan (PROMETHAZINE -DM) 6.25-15 MG/5ML syrup Take 5 mLs by mouth at bedtime as needed. 118 mL Kristeena Meineke R, NP   oseltamivir  (TAMIFLU ) 75 MG capsule Take 1 capsule (75 mg total) by mouth every 12 (twelve) hours. 10 capsule Guillermina Shaft R, NP   albuterol  (PROVENTIL ) (2.5 MG/3ML) 0.083% nebulizer solution Take 3 mLs (2.5 mg total) by nebulization every 6 (six) hours as needed for wheezing or shortness of breath. 75 mL Haley Fuerstenberg, Shelba SAUNDERS, NP      PDMP not reviewed this encounter.   Teresa Shelba SAUNDERS, NP 12/26/23 1501

## 2024-03-25 ENCOUNTER — Ambulatory Visit: Admission: EM | Admit: 2024-03-25 | Discharge: 2024-03-25 | Disposition: A | Discharge status: Home / Self Care

## 2024-03-25 DIAGNOSIS — B070 Plantar wart: Secondary | ICD-10-CM

## 2024-03-25 NOTE — ED Triage Notes (Signed)
 Pt reports to UC for glass that has been in her left foot x 1 week.  Pt states the provider she had seen said to soak foot, and she has soaked it but no relief. Pt states it is hard to wear shoes and walk on the left foot.

## 2024-03-25 NOTE — ED Provider Notes (Signed)
 Kimberly Henderson    CSN: [id number] Arrival date & time: 03/25/24  1722      History   Chief Complaint Chief Complaint  Patient presents with   glass in foot    HPI Kimberly Henderson is a 23 y.o. female.  Patient presents with 6-week history of painful area on the bottom of her left foot.  She thinks she may have stepped on glass.  Her mother attempted to remove any foreign body with a pair of tweezers.  Patient states initially the area felt better but then began to hurt again 1 week ago.  The pain is worse with ambulation and weightbearing.  No redness or drainage.  The history is provided by the patient and medical records.    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 No pertinent surgical history.  OB History   No obstetric history on file.      Home Medications    Prior to Admission medications   Medication Sig Start Date End Date Taking? Authorizing Provider  albuterol  (PROVENTIL ) (2.5 MG/3ML) 0.083% nebulizer solution Take 3 mLs (2.5 mg total) by nebulization every 6 (six) hours as needed for wheezing or shortness of breath. 12/26/23   Reena Canning, NP  albuterol  (VENTOLIN  HFA) 108 (90 Base) MCG/ACT inhaler Inhale 1-2 puffs into the lungs every 6 (six) hours as needed. 09/21/23   Wellington Half, NP  amoxicillin -clavulanate (AUGMENTIN ) 875-125 MG tablet Take 1 tablet by mouth  every 12 (twelve) hours. Patient not taking: Reported on 09/21/2023 01/24/23   Immordino, Mara Semin[place], FNP  benzonatate  (TESSALON ) 100 MG capsule Take 1 capsule (100 mg total) by mouth every 8 (eight) hours. 12/26/23   Reena Canning, NP  cetirizine  (ZYRTEC  ALLERGY ) 10 MG tablet Take 1 tablet (10 mg total) by mouth daily. 07/11/21   Adolph Hoop, PA-C  EPINEPHrine  (EPIPEN  2-PAK) 0.3 mg/0.3 mL SOAJ injection Inject 0.3 mLs (0.3 mg total) into the muscle once as needed (for severe allergic reaction). CAll 911 immediately if you have to use this medicine 01/04/14   Panosh, Wanda K, MD  fluticasone  (FLONASE ) 50 MCG/ACT nasal spray Place 1 spray into both nostrils daily. 01/31/23   Wellington Half, NP  levocetirizine (XYZAL) 5 MG tablet SMARTSIG:1 Tablet(s) By Mouth Every Evening 11/22/20   [provider]  naproxen  (NAPROSYN ) 375 MG tablet Take 1 tablet (375 mg total) by mouth 2 (two) times daily with a meal. 07/11/21   Adolph Hoop, PA-C  oseltamivir  (TAMIFLU ) 75 MG capsule Take 1 capsule (75 mg total) by mouth every 12 (twelve) hours. 12/26/23   White, Maybelle Spatz, NP  predniSONE  (STERAPRED UNI-PAK 21 TAB) 10 MG (21) TBPK tablet Take by mouth daily. Take 6 tabs by mouth daily  for 1 days, then 5 tabs for 1 days, then 4 tabs for  1 days, then 3 tabs for 1 days, 2 tabs for 1 days, then 1 tab by mouth daily for 1 days 12/26/23   Reena Canning, NP  promethazine -dextromethorphan (PROMETHAZINE -DM) 6.25-15 MG/5ML syrup Take 5 mLs by mouth at bedtime as needed. 12/26/23   White, Maybelle Spatz, NP  pseudoephedrine  (SUDAFED) 60 MG tablet Take 1 tablet (60 mg total) by mouth every 8 (eight) hours as needed for congestion. Patient not taking: Reported on 01/31/2023 07/11/21   Adolph Hoop, PA-C  Spacer/Aero-Holding Chambers (E-Z SPACER) inhaler Use as instructed 11/26/18   Koberlein, Junell C, MD    Family History Family History  Problem Relation Age of Onset   Irritable bowel syndrome Mother    Colitis Mother    GER disease  Mother    Obesity Sister    Hypertension Maternal Aunt    Diabetes Maternal Grandmother    Hypertension Maternal Grandmother    Thyroid  disease Maternal Grandmother    Diabetes Paternal Grandmother    Hypertension Paternal Grandmother     Social History Social History   Tobacco Use   Smoking status: Never   Smokeless tobacco: Never  Vaping Use   Vaping status: Never Used  Substance Use Topics   Alcohol use: Yes    Comment: social drinker   Drug use: No     Allergies   Ibuprofen    Review of Systems Review of Systems  Constitutional:  Negative for chills and fever.  Musculoskeletal:  Positive for gait problem. Negative for arthralgias and joint swelling.  Skin:  Positive for wound. Negative for color change.  Neurological:  Negative for weakness and numbness.     Physical Exam Triage Vital Signs ED Triage Vitals [03/25/24 1747]  Encounter Vitals Group     BP 107/72     Systolic BP Percentile      Diastolic BP Percentile      Pulse Rate 99     Resp 18     Temp 98.9 F (37.2 C)     Temp Source Oral     SpO2 98 %     Weight      Height      Head Circumference      Peak Flow      Pain Score 8     Pain Loc      Pain Education      Exclude from Growth Chart    No data found.  Updated Vital Signs BP 107/72   Pulse 99   Temp 98.9 F (37.2 C) (Oral)   Resp 18   LMP 03/01/2024   SpO2 98%   Visual Acuity Right Eye Distance:   Left Eye Distance:   Bilateral Distance:    Right Eye Near:   Left Eye Near:    Bilateral Near:     Physical Exam Constitutional:      General: She is not in acute distress.    Appearance: She is obese.  HENT:     Mouth/Throat:     Mouth: Mucous membranes are moist.  Cardiovascular:     Rate and Rhythm: Normal rate and regular rhythm.  Pulmonary:     Effort: Pulmonary effort is normal. No respiratory distress.  Musculoskeletal:        General: No swelling or deformity. Normal range of motion.       Feet:  Skin:     General: Skin is warm and dry.     Capillary Refill: Capillary refill takes less than 2 seconds.  Findings: Lesion present.     Comments: No foreign body noted.  No erythema or drainage.  Neurological:     General: No focal deficit present.     Mental Status: She is alert.     Sensory: No sensory deficit.     Motor: No weakness.     Gait: Gait normal.      UC Treatments / Results  Labs (all labs ordered are listed, but only abnormal results are displayed) Labs Reviewed - No data to display  EKG   Radiology No results found.  Procedures Procedures (including critical care time)  Medications Ordered in UC Medications - No data to display  Initial Impression / Assessment and Plan / UC Course  I have reviewed the triage vital signs and the nursing notes.  Pertinent labs & imaging results that were available during my care of the patient were reviewed by me and considered in my medical decision making (see chart for details).   Plantar wart of left foot.  Afebrile and vital signs are stable.  Education provided on plantar wart.  Instructed patient to follow-up with a podiatrist.  Contact information for on-call podiatry provided.  She agrees to plan of care. Final Clinical Impressions(s) / UC Diagnoses   Final diagnoses:  Plantar wart of left foot     Discharge Instructions      Follow up with a podiatrist.      ED Prescriptions   None    PDMP not reviewed this encounter.   Wellington Half, NP 03/25/24 [phone]

## 2024-03-25 NOTE — Discharge Instructions (Addendum)
Follow up with a podiatrist.   °

## 2024-04-08 ENCOUNTER — Ambulatory Visit (INDEPENDENT_AMBULATORY_CARE_PROVIDER_SITE_OTHER): Admitting: Podiatry

## 2024-04-08 ENCOUNTER — Encounter: Payer: Self-pay | Admitting: Podiatry

## 2024-04-08 DIAGNOSIS — B070 Plantar wart: Secondary | ICD-10-CM | POA: Diagnosis not present

## 2024-04-08 DIAGNOSIS — D2372 Other benign neoplasm of skin of left lower limb, including hip: Secondary | ICD-10-CM | POA: Diagnosis not present

## 2024-04-08 NOTE — Progress Notes (Unsigned)
  Chief Complaint  Patient presents with   Foot Pain    Possible Plantar Wart on the left heel. Dark in the center and calloused around it. She is very very nervous. Been present for over a month and painful. She thought it was glass at first, she went to urgent care and was told it was not glass and possibly a plantar wart. Not diabetic and no  anti coag.    HPI: 23 y.o. female presents today with c/o painful skin lesion on plantar left midfoot.  Denies stepping on a foreign object.  States it has been quite painful.  Past Medical History:  Diagnosis Date   Asthma    Eczema    type rash   History of angioedema    Obesity    Seasonal allergies    singular as needed; worse in cold weather   Twin birth, mate liveborn    No past surgical history on file. Allergies  Allergen Reactions   Ibuprofen      GI hx  Physical Exam: Palpable pedal pulses.  Focal hyperkeratotic skin lesion with honeycomb-shaped hyperkeratosis in center.  POP of lesion.  No evidence of  foreign body.  No drainage.  Epicritic sensation intact.  Assessment/Plan of Care: 1. Plantar wart   2. Benign neoplasm of skin of left foot     The wart was shaved and 2 rounds of Cantharone solution were applied to the leison.  Offloading felt pads were dispensed.    F/u 3 months   Ashling Roane DEstle Hemp, DPM, FACFAS [facility]     [address] [place], [place] [address]                Office [phone]  Fax [phone]

## 2024-05-07 ENCOUNTER — Encounter: Payer: Self-pay | Admitting: Podiatry

## 2024-05-07 ENCOUNTER — Ambulatory Visit (INDEPENDENT_AMBULATORY_CARE_PROVIDER_SITE_OTHER): Admitting: Podiatry

## 2024-05-07 DIAGNOSIS — D2372 Other benign neoplasm of skin of left lower limb, including hip: Secondary | ICD-10-CM | POA: Diagnosis not present

## 2024-05-10 NOTE — Progress Notes (Signed)
  Chief Complaint  Patient presents with   Skin Lesion    Left lateral skin lesion with callous today. Appears the same size and a little pain with walking pressure. Not diabetic and no anti coag.     HPI: 23 y.o. female presents today for follow-up of painful skin lesion located on the plantar lateral aspect of the left midfoot.  She still has pain to the area today.  She did have pain for couple days after last treatment.    Past Medical History:  Diagnosis Date   Asthma    Eczema    type rash   History of angioedema    Obesity    Seasonal allergies    singular as needed; worse in cold weather   Twin birth, mate liveborn    No past surgical history on file. Allergies  Allergen Reactions   Ibuprofen      GI hx    Physical Exam: Palpable pedal pulses.  Upon gentle debridement of the hyperkeratotic lesion, the remaining lesion is smaller than on last visit.  There is no erythema or active blister noted.  There is pain on palpation of the lesion.  Assessment/Plan of Care: 1. Benign neoplasm of skin of left foot     Treatment included shaving of the painful lesion with a sterile #313 blade.  Cantharone solution was applied to the lesion post-shaving, followed by Band-Aid occlusion.  Patient will remove this in 4 to 6 hours and may expect blistering to the area in 24 to 48 hours.  Follow-up in 3 to 4 weeks for recheck.   Awanda CHARM Imperial, DPM, FACFAS [facility]     [address] [place], [place] [address]                Office [phone]  Fax [phone]

## 2024-05-29 ENCOUNTER — Emergency Department
Admission: EM | Admit: 2024-05-29 | Discharge: 2024-05-29 | Disposition: A | Discharge status: Home / Self Care | Attending: Emergency Medicine | Admitting: Emergency Medicine

## 2024-05-29 ENCOUNTER — Other Ambulatory Visit: Payer: Self-pay

## 2024-05-29 DIAGNOSIS — T7840XA Allergy, unspecified, initial encounter: Secondary | ICD-10-CM | POA: Insufficient documentation

## 2024-05-29 DIAGNOSIS — R21 Rash and other nonspecific skin eruption: Secondary | ICD-10-CM | POA: Diagnosis present

## 2024-05-29 DIAGNOSIS — J45909 Unspecified asthma, uncomplicated: Secondary | ICD-10-CM | POA: Diagnosis not present

## 2024-05-29 MED ORDER — PREDNISONE 20 MG PO TABS
60.0000 mg | ORAL_TABLET | Freq: Once | ORAL | Status: AC
  Administered 2024-05-29: 60 mg via ORAL
  Filled 2024-05-29: qty 3

## 2024-05-29 MED ORDER — PREDNISONE 50 MG PO TABS: ORAL_TABLET | ORAL | 0 refills | Status: AC

## 2024-05-29 MED ORDER — ACETAMINOPHEN 325 MG PO TABS
650.0000 mg | ORAL_TABLET | Freq: Once | ORAL | Status: AC
  Administered 2024-05-29: 650 mg via ORAL
  Filled 2024-05-29: qty 2

## 2024-05-29 MED ORDER — FAMOTIDINE 20 MG PO TABS
40.0000 mg | ORAL_TABLET | Freq: Once | ORAL | Status: AC
  Administered 2024-05-29: 40 mg via ORAL
  Filled 2024-05-29: qty 2

## 2024-05-29 NOTE — ED Provider Notes (Signed)
 [facility] Provider Note    Event Date/Time   First MD Initiated Contact with Patient 05/29/24 1526     (approximate)   History   Rash   HPI  Kimberly Henderson is a 23 y.o. 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  Patient had an exposure to mouse around 8 AM this morning.  She presented to urgent care for evaluation was going to have to wait an hour so she left and called an ambulance from the side of the road.  Patient was given Benadryl  by the EMS team at around 1230.  She reports that her symptoms are continuing to improve.  No difficulty breathing, no tongue swelling, no lip swelling, states that her throat did feel itchy but no longer.      Physical Exam   Triage Vital Signs: ED Triage Vitals  Encounter Vitals Group     BP 05/29/24 1322 117/60     Girls Systolic BP Percentile --      Girls Diastolic BP Percentile --      Boys Systolic BP Percentile --      Boys Diastolic BP Percentile --      Pulse Rate 05/29/24 1322 87     Resp 05/29/24 1322 18     Temp 05/29/24 1322 98.4 F (36.9 C)     Temp Source 05/29/24 1322 Oral     SpO2 05/29/24 1322 100 %     Weight --      Height --      Head Circumference --      Peak Flow --      Pain Score 05/29/24 1259 3     Pain Loc --      Pain Education --      Exclude from Growth Chart --     Most recent vital signs: Vitals:   05/29/24 1322 05/29/24 1735  BP: 117/60 121/77  Pulse: 87 83  Resp: 18 18  Temp: 98.4 F (36.9 C)   SpO2: 100% 100%   General: Awake, no distress.  CV:  Good peripheral perfusion. RRR. Resp:  Normal effort. CTAB. Speaking in full sentences, tolerating secretions. Abd:  No distention.  Other:  Rash on bilateral cheeks under eyes, no urticaria, no tongue/lip swelling   ED Results / Procedures / Treatments   Labs (all labs ordered are listed, but only  abnormal results are displayed) Labs Reviewed - No data to display    PROCEDURES:  Critical Care performed: No  Procedures   MEDICATIONS ORDERED IN ED: Medications  famotidine  (PEPCID ) tablet 40 mg (40 mg Oral Given 05/29/24 1642)  predniSONE  (DELTASONE ) tablet 60 mg (60 mg Oral Given 05/29/24 1643)  acetaminophen  (TYLENOL ) tablet 650 mg (650 mg Oral Given 05/29/24 1643)     IMPRESSION / MDM / ASSESSMENT AND PLAN / ED COURSE  I reviewed the triage vital signs and the nursing notes.                             23 year old female presents for evaluation of a rash and allergic reaction.  Vital signs are stable.  Patient NAD on exam.  Differential diagnosis includes, but is not limited to, allergic reaction, hives, less likely angioedema and anaphylaxis.  Patient's presentation is most consistent with acute, uncomplicated illness.  Patient was already given Benadryl  by the  EMS team so we will give her some famotidine  and prednisone  and monitor for improvement of symptoms.  Physical exam is overall reassuring.  Very low suspicion for anaphylaxis as patient is not having any wheezing, shortness of breath, nausea, vomiting, diarrhea, hypotension or altered mental status.  Patient reported no worsening of symptoms after receiving the famotidine  and prednisone .  Will send her home with a short course of steroids.  Recommended Benadryl  as needed and to continue taking the Xyzal daily.  Discussed signs and symptoms of anaphylaxis to watch for.  Advised strict return precautions.  Patient voiced understanding, all questions were answered and she was stable at discharge.     FINAL CLINICAL IMPRESSION(S) / ED DIAGNOSES   Final diagnoses:  Allergic reaction, initial encounter     Rx / DC Orders   ED Discharge Orders          Ordered    predniSONE  (DELTASONE ) 50 MG tablet        05/29/24 1743             Note:  This document was prepared using Dragon voice recognition software  and may include unintentional dictation errors.   Cleaster Tinnie LABOR, PA-C 05/29/24 1744    Malvina Alm DASEN, MD 05/30/24 1104

## 2024-05-29 NOTE — Discharge Instructions (Signed)
 Please take the prednisone  once a day for the next 4 days.  You can take the Benadryl  as needed for itching.  Continue taking your Xyzal as prescribed.  Return to the emergency department with any worsening symptoms or concerns for breathing difficulty.

## 2024-05-29 NOTE — ED Triage Notes (Signed)
 Pt comes via EMs from car with c/o rash. Pt has allergy  to nuts and did have contact with nuts earlier. Pt states itching to face and head. Pt states burning.   Pt given 50 of ben for itching at 1230. Pt has 20 g in arm. Pt states eye watering too but face feels on fire.

## 2024-05-29 NOTE — ED Notes (Signed)
 Pt is CAOx4, breathing normally, and normal in color. Pt is complaining of an allergic reaction to an unknown source. Pt reports being allergic to peanuts and was possibly exposed to same earlier today. Pt does have some redness/rash to her face, and pt reports itching all over. Pt denies SOB or any other symptoms at this time.

## 2024-06-04 ENCOUNTER — Encounter: Admitting: Podiatry

## 2024-06-07 NOTE — Progress Notes (Signed)
Patient did not show for scheduled appointment today.

## 2024-07-07 ENCOUNTER — Ambulatory Visit
Admission: EM | Admit: 2024-07-07 | Discharge: 2024-07-07 | Disposition: A | Discharge status: Home / Self Care | Attending: Emergency Medicine | Admitting: Emergency Medicine

## 2024-07-07 DIAGNOSIS — R112 Nausea with vomiting, unspecified: Secondary | ICD-10-CM

## 2024-07-07 DIAGNOSIS — R197 Diarrhea, unspecified: Secondary | ICD-10-CM | POA: Diagnosis not present

## 2024-07-07 DIAGNOSIS — R1010 Upper abdominal pain, unspecified: Secondary | ICD-10-CM

## 2024-07-07 DIAGNOSIS — Z3202 Encounter for pregnancy test, result negative: Secondary | ICD-10-CM

## 2024-07-07 LAB — POCT URINE PREGNANCY: Preg Test, Ur: NEGATIVE

## 2024-07-07 MED ORDER — ONDANSETRON 4 MG PO TBDP: 4.0000 mg | ORAL_TABLET | Freq: Three times a day (TID) | ORAL | 0 refills | Status: AC | PRN

## 2024-07-07 MED ORDER — ONDANSETRON 4 MG PO TBDP
4.0000 mg | ORAL_TABLET | Freq: Once | ORAL | Status: AC
Start: 2024-07-07 — End: 2024-07-07
  Administered 2024-07-07: 4 mg via ORAL

## 2024-07-07 NOTE — Discharge Instructions (Addendum)
 Follow up with your primary care provider tomorrow.  Go to the emergency department if you have worsening symptoms.    Take the antinausea medication as directed.    Keep yourself hydrated with clear liquids, such as water.  Follow the diarrhea diet as tolerated.

## 2024-07-07 NOTE — ED Provider Notes (Signed)
 CAY RALPH PELT    CSN: [id number] Arrival date & time: 07/07/24  9063      History   Chief Complaint Chief Complaint  Patient presents with   Abdominal Pain   Nausea   Emesis   Diarrhea    HPI Kimberly Henderson is a 23 y.o. female.  Patient presents with upper abdominal pain, nausea, vomiting, diarrhea since last night.  She states her symptoms started after she ate [place] food which was rice and chicken; no raw food.  She denies fever, chills, blood in vomit, blood in stool, dysuria, hematuria.  Patient attempted treatment with Pepto-Bismol but vomited immediately.  No recent travel out of the country or antibiotic use.  The history is provided by the patient and medical records.    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 No pertinent surgical history.  OB History   No obstetric history on file.      Home Medications    Prior to Admission medications   Medication Sig Start Date End Date Taking? Authorizing Provider  ondansetron  (ZOFRAN -ODT) 4 MG disintegrating tablet Take 1 tablet (4 mg total) by mouth every 8 (eight) hours as needed for nausea or vomiting. 07/07/24  Yes Corlis Burnard DEL, NP  albuterol  (PROVENTIL ) (2.5 MG/3ML) 0.083% nebulizer solution Take 3 mLs (2.5 mg total) by nebulization every 6 (six) hours as needed for wheezing or shortness of breath. 12/26/23   Teresa Shelba SAUNDERS, NP  albuterol  (VENTOLIN  HFA) 108 (90 Base) MCG/ACT inhaler Inhale 1-2 puffs into the lungs every 6 (six) hours as needed. 09/21/23   Corlis Burnard DEL, NP  amoxicillin -clavulanate (AUGMENTIN ) 875-125 MG tablet Take 1 tablet by mouth every 12 (twelve) hours. 01/24/23   Immordino, Garnette, FNP  benzonatate  (TESSALON ) 100 MG capsule Take 1 capsule (100 mg total) by mouth every 8 (eight) hours. 12/26/23   Teresa Shelba SAUNDERS, NP  cetirizine  (ZYRTEC  ALLERGY ) 10 MG tablet Take 1 tablet (10 mg total) by mouth daily. 07/11/21   Christopher Savannah, PA-C  EPINEPHrine  (EPIPEN  2-PAK) 0.3 mg/0.3 mL SOAJ injection Inject 0.3 mLs (0.3 mg total) into the muscle once as needed (for severe allergic reaction). CAll 911 immediately if you have to use this medicine 01/04/14   Panosh, Apolinar POUR, MD  fluticasone  (FLONASE ) 50 MCG/ACT nasal spray Place 1 spray into both nostrils daily. 01/31/23   Corlis Burnard DEL, NP  levocetirizine (XYZAL) 5 MG tablet SMARTSIG:1 Tablet(s) By Mouth Every Evening 11/22/20   [provider]  naproxen  (NAPROSYN ) 375 MG tablet Take 1 tablet (375 mg total) by mouth 2 (two) times daily with a meal. 07/11/21   Christopher Savannah, PA-C  oseltamivir  (TAMIFLU ) 75 MG capsule Take 1 capsule (75 mg total) by mouth every 12 (twelve) hours. 12/26/23   Teresa Shelba SAUNDERS, NP  predniSONE  (DELTASONE ) 50 MG tablet  Take 1 tablet/day for the next 4 days. 05/29/24   Cleaster Tinnie LABOR, PA-C  promethazine -dextromethorphan (PROMETHAZINE -DM) 6.25-15 MG/5ML syrup Take 5 mLs by mouth at bedtime as needed. 12/26/23   Teresa Shelba SAUNDERS, NP  pseudoephedrine  (SUDAFED) 60 MG tablet Take 1 tablet (60 mg total) by mouth every 8 (eight) hours as needed for congestion. 07/11/21   Christopher Savannah, PA-C  Spacer/Aero-Holding Chambers (E-Z SPACER) inhaler Use as instructed 11/26/18   Koberlein, Junell C, MD    Family History Family History  Problem Relation Age of Onset   Irritable bowel syndrome Mother    Colitis Mother    GER disease Mother     Obesity Sister    Hypertension Maternal Aunt    Diabetes Maternal Grandmother    Hypertension Maternal Grandmother    Thyroid  disease Maternal Grandmother    Diabetes Paternal Grandmother    Hypertension Paternal Grandmother     Social History Social History   Tobacco Use   Smoking status: Never   Smokeless tobacco: Never  Vaping Use   Vaping status: Never Used  Substance Use Topics   Alcohol use: Yes    Comment: social drinker   Drug use: No     Allergies   Ibuprofen    Review of Systems Review of Systems  Constitutional:  Negative for chills and fever.  Gastrointestinal:  Positive for abdominal pain, diarrhea, nausea and vomiting. Negative for blood in stool and constipation.  Genitourinary:  Negative for dysuria and hematuria.     Physical Exam Triage Vital Signs ED Triage Vitals [07/07/24 0943]  Encounter Vitals Group     BP 100/70     Girls Systolic BP Percentile      Girls Diastolic BP Percentile      Boys Systolic BP Percentile      Boys Diastolic BP Percentile      Pulse Rate (!) 103     Resp 18     Temp 98.1 F (36.7 C)     Temp Source Oral     SpO2 97 %     Weight      Height      Head Circumference      Peak Flow      Pain Score 8     Pain Loc      Pain Education      Exclude from Growth Chart    No data found.  Updated Vital Signs BP 100/70 (BP Location: Left Arm)   Pulse (!) 103   Temp 98.1 F (36.7 C) (Oral)   Resp 18   SpO2 97%   Visual Acuity Right Eye Distance:   Left Eye Distance:   Bilateral Distance:    Right Eye Near:   Left Eye Near:    Bilateral Near:     Physical Exam Constitutional:      General: She is not in acute distress. HENT:     Mouth/Throat:     Mouth: Mucous membranes are moist.  Cardiovascular:     Rate and Rhythm: Normal rate and regular rhythm.     Heart sounds: Normal heart sounds.  Pulmonary:     Effort: Pulmonary effort is normal. No respiratory distress.     Breath sounds: Normal breath  sounds.  Abdominal:     General: Bowel sounds are normal.     Palpations: Abdomen is soft.     Tenderness: There is abdominal tenderness in the epigastric area. There is no right CVA tenderness, left CVA tenderness, guarding or  rebound.     Comments: Mild tenderness to palpation of epigastric region.  No rebound or guarding.  Neurological:     Mental Status: She is alert.      UC Treatments / Results  Labs (all labs ordered are listed, but only abnormal results are displayed) Labs Reviewed  POCT URINE PREGNANCY - Normal    EKG   Radiology No results found.  Procedures Procedures (including critical care time)  Medications Ordered in UC Medications  ondansetron  (ZOFRAN -ODT) disintegrating tablet 4 mg (4 mg Oral Given 07/07/24 1054)    Initial Impression / Assessment and Plan / UC Course  I have reviewed the triage vital signs and the nursing notes.  Pertinent labs & imaging results that were available during my care of the patient were reviewed by me and considered in my medical decision making (see chart for details).    Upper abdominal pain, nausea vomiting and diarrhea, negative pregnancy test.  Afebrile.  Zofran  given here and patient able to tolerate oral liquids without difficulty.  Treating nausea and vomiting with Zofran .  Discussed clear liquid diet.  Instructed patient to advance to diarrhea diet as tolerated.  Discussed maintaining oral hydration at home; ED precautions discussed.  Education provided on nausea and vomiting, diarrhea, abdominal pain.  Instructed patient to follow-up with her PCP.  She agrees to plan of care.   Final Clinical Impressions(s) / UC Diagnoses   Final diagnoses:  Pain of upper abdomen  Nausea vomiting and diarrhea  Negative pregnancy test     Discharge Instructions      Follow up with your primary care provider tomorrow.  Go to the emergency department if you have worsening symptoms.    Take the antinausea medication as  directed.    Keep yourself hydrated with clear liquids, such as water.  Follow the diarrhea diet as tolerated.       ED Prescriptions     Medication Sig Dispense Auth. Provider   ondansetron  (ZOFRAN -ODT) 4 MG disintegrating tablet Take 1 tablet (4 mg total) by mouth every 8 (eight) hours as needed for nausea or vomiting. 20 tablet Corlis Burnard DEL, NP      PDMP not reviewed this encounter.   Corlis Burnard DEL, NP 07/07/24 1115

## 2024-07-07 NOTE — ED Triage Notes (Addendum)
 Patient stets that she thinks that she may have food poisoning. Patient states that she's been having nausea,diarrhea and vomiting since last night. Patient states that she ate [place] food. Sx started after she ate. Patient states that she tried taking pepto but threw it back up.

## 2024-07-10 ENCOUNTER — Emergency Department (HOSPITAL_COMMUNITY)

## 2024-07-10 ENCOUNTER — Encounter (HOSPITAL_COMMUNITY): Payer: Self-pay

## 2024-07-10 ENCOUNTER — Emergency Department (HOSPITAL_COMMUNITY)
Admission: EM | Admit: 2024-07-10 | Discharge: 2024-07-11 | Disposition: A | Discharge status: Home / Self Care | Attending: Emergency Medicine | Admitting: Emergency Medicine

## 2024-07-10 ENCOUNTER — Ambulatory Visit: Payer: Self-pay

## 2024-07-10 ENCOUNTER — Other Ambulatory Visit: Payer: Self-pay

## 2024-07-10 DIAGNOSIS — Z7952 Long term (current) use of systemic steroids: Secondary | ICD-10-CM | POA: Insufficient documentation

## 2024-07-10 DIAGNOSIS — Z7951 Long term (current) use of inhaled steroids: Secondary | ICD-10-CM | POA: Insufficient documentation

## 2024-07-10 DIAGNOSIS — R197 Diarrhea, unspecified: Secondary | ICD-10-CM | POA: Insufficient documentation

## 2024-07-10 DIAGNOSIS — R112 Nausea with vomiting, unspecified: Secondary | ICD-10-CM | POA: Insufficient documentation

## 2024-07-10 DIAGNOSIS — E876 Hypokalemia: Secondary | ICD-10-CM | POA: Insufficient documentation

## 2024-07-10 DIAGNOSIS — R1013 Epigastric pain: Secondary | ICD-10-CM | POA: Diagnosis not present

## 2024-07-10 DIAGNOSIS — J45909 Unspecified asthma, uncomplicated: Secondary | ICD-10-CM | POA: Insufficient documentation

## 2024-07-10 LAB — CBC WITH DIFFERENTIAL/PLATELET
Abs Immature Granulocytes: 0.01 K/uL (ref 0.00–0.07)
Basophils Absolute: 0 K/uL (ref 0.0–0.1)
Basophils Relative: 0 %
Eosinophils Absolute: 0 K/uL (ref 0.0–0.5)
Eosinophils Relative: 0 %
HCT: 38.7 % (ref 36.0–46.0)
Hemoglobin: 12.5 g/dL (ref 12.0–15.0)
Immature Granulocytes: 0 %
Lymphocytes Relative: 29 %
Lymphs Abs: 1.3 K/uL (ref 0.7–4.0)
MCH: 30.9 pg (ref 26.0–34.0)
MCHC: 32.3 g/dL (ref 30.0–36.0)
MCV: 95.8 fL (ref 80.0–100.0)
Monocytes Absolute: 0.4 K/uL (ref 0.1–1.0)
Monocytes Relative: 9 %
Neutro Abs: 2.7 K/uL (ref 1.7–7.7)
Neutrophils Relative %: 62 %
Platelets: 296 K/uL (ref 150–400)
RBC: 4.04 MIL/uL (ref 3.87–5.11)
RDW: 13.7 % (ref 11.5–15.5)
WBC: 4.4 K/uL (ref 4.0–10.5)
nRBC: 0 % (ref 0.0–0.2)

## 2024-07-10 LAB — COMPREHENSIVE METABOLIC PANEL WITH GFR
ALT: 14 U/L (ref 0–44)
AST: 19 U/L (ref 15–41)
Albumin: 3.8 g/dL (ref 3.5–5.0)
Alkaline Phosphatase: 41 U/L (ref 38–126)
Anion gap: 12 (ref 5–15)
BUN: 7 mg/dL (ref 6–20)
CO2: 23 mmol/L (ref 22–32)
Calcium: 9 mg/dL (ref 8.9–10.3)
Chloride: 104 mmol/L (ref 98–111)
Creatinine, Ser: 0.54 mg/dL (ref 0.44–1.00)
GFR, Estimated: >60 mL/min (ref >60)
Glucose, Bld: 86 mg/dL (ref 70–99)
Potassium: 3.4 mmol/L — ABNORMAL LOW (ref 3.5–5.1)
Sodium: 139 mmol/L (ref 135–145)
Total Bilirubin: 0.7 mg/dL (ref 0.0–1.2)
Total Protein: 7.6 g/dL (ref 6.5–8.1)

## 2024-07-10 LAB — HCG, SERUM, QUALITATIVE: Preg, Serum: NEGATIVE

## 2024-07-10 LAB — LIPASE, BLOOD: Lipase: 27 U/L (ref 11–51)

## 2024-07-10 MED ORDER — LACTATED RINGERS IV BOLUS
1000.0000 mL | Freq: Once | INTRAVENOUS | Status: AC
  Administered 2024-07-10: 1000 mL via INTRAVENOUS

## 2024-07-10 MED ORDER — IOHEXOL 300 MG/ML  SOLN
100.0000 mL | Freq: Once | INTRAMUSCULAR | Status: AC | PRN
  Administered 2024-07-10: 100 mL via INTRAVENOUS

## 2024-07-10 MED ORDER — POTASSIUM CHLORIDE CRYS ER 20 MEQ PO TBCR
20.0000 meq | EXTENDED_RELEASE_TABLET | Freq: Once | ORAL | Status: AC
  Administered 2024-07-10: 20 meq via ORAL
  Filled 2024-07-10: qty 1

## 2024-07-10 NOTE — ED Provider Triage Note (Signed)
 Emergency Medicine Provider Triage Evaluation Note  Kimberly Henderson , a 23 y.o. female  was evaluated in triage.  Pt complains of abdominal pain, diarrhea, vomiting that started Monday night.  Vomiting has resolved.  Abdominal pain is in the epigastric region.  No blood in stool  Review of Systems  Positive: As above Negative: As above  Physical Exam  BP 103/66 (BP Location: Left Arm)   Pulse 94   Temp 98.2 F (36.8 C) (Oral)   Resp 18   Ht 5' 2 (1.575 m)   Wt 104.3 kg   SpO2 98%   BMI 42.07 kg/m  Gen:   Awake, no distress   Resp:  Normal effort MSK:   Moves extremities without difficulty  Other:    Medical Decision Making  Medically screening exam initiated at 4:54 PM.  Appropriate orders placed.  Kimberly Henderson was informed that the remainder of the evaluation will be completed by another provider, this initial triage assessment does not replace that evaluation, and the importance of remaining in the ED until their evaluation is complete.     Hildegard Loge, PA-C 07/10/24 [phone]

## 2024-07-10 NOTE — ED Provider Notes (Signed)
 [place] EMERGENCY DEPARTMENT AT [facility] Provider Note   CSN: [id number] Arrival date & time: 07/10/24  8380     Patient presents with: Abdominal Pain and Diarrhea   Kimberly Henderson is a 23 y.o. female.   HPI      Diarrhea 7 times today, watery after drinking or eating Vomiting has stopped with the zofran , still has a little bit of nausea   Has not been able to eat since Monday night, will have soups and then diarrhea  Abdominal pain, epigastric and across, feels like blowing up balloon and releasing slowly, like a spasm, consistent has been 5 days, then stomach will bubble and rumble and can tell will have diarrhea  Had endoscopy was ok in 2020 [facility] peds, was having abdominal pain.  Mom has hx of UC, brother with ulcers.   No suspicious foods, did have chinese food that was sitting out for a while before ate it on Monday and then was sick. No recent abx, but did have recent steroid with allergic reaction  No travel Mom said sister was sick Sunday-Monday, was around him on Sunday, brother was also sick but not around him, his kids around  Past Medical History:  Diagnosis Date   Asthma    Eczema    type rash   History of angioedema    Obesity    Seasonal allergies    singular as needed; worse in cold weather   Twin birth, mate liveborn     History reviewed. No pertinent surgical history.  Prior to Admission medications   Medication Sig Start Date End Date Taking? Authorizing Provider  dicyclomine  (BENTYL ) 20 MG tablet Take 1 tablet (20 mg total) by mouth 2 (two) times daily. 07/11/24  Yes Dreama Longs, MD  ondansetron  (ZOFRAN -ODT) 4 MG disintegrating tablet Take 1 tablet (4 mg total) by mouth every 8 (eight) hours as needed for nausea or vomiting. 07/11/24  Yes Dreama Longs, MD  pantoprazole  (PROTONIX ) 20 MG tablet Take 2 tablets (40 mg total) by mouth daily for 14 days. 07/10/24 07/24/24 Yes Dreama Longs, MD  albuterol  (PROVENTIL ) (2.5  MG/3ML) 0.083% nebulizer solution Take 3 mLs (2.5 mg total) by nebulization every 6 (six) hours as needed for wheezing or shortness of breath. 12/26/23   Teresa Shelba SAUNDERS, NP  albuterol  (VENTOLIN  HFA) 108 (90 Base) MCG/ACT inhaler Inhale 1-2 puffs into the lungs every 6 (six) hours as needed. 09/21/23   Corlis Burnard DEL, NP  amoxicillin -clavulanate (AUGMENTIN ) 875-125 MG tablet Take 1 tablet by mouth every 12 (twelve) hours. 01/24/23   Immordino, Garnette, FNP  benzonatate  (TESSALON ) 100 MG capsule Take 1 capsule (100 mg total) by mouth every 8 (eight) hours. 12/26/23   Teresa Shelba SAUNDERS, NP  cetirizine  (ZYRTEC  ALLERGY ) 10 MG tablet Take 1 tablet (10 mg total) by mouth daily. 07/11/21   Christopher Savannah, PA-C  EPINEPHrine  (EPIPEN  2-PAK) 0.3 mg/0.3 mL SOAJ injection Inject 0.3 mLs (0.3 mg total) into the muscle once as needed (for severe allergic reaction). CAll 911 immediately if you have to use this medicine 01/04/14   Panosh, Apolinar POUR, MD  fluticasone  (FLONASE ) 50 MCG/ACT nasal spray Place 1 spray into both nostrils daily. 01/31/23   Corlis Burnard DEL, NP  levocetirizine (XYZAL) 5 MG tablet SMARTSIG:1 Tablet(s) By Mouth Every Evening 11/22/20   [provider]  naproxen  (NAPROSYN ) 375 MG tablet Take 1 tablet (375 mg total) by mouth 2 (two) times daily with a meal. 07/11/21   Christopher Savannah, PA-C  ondansetron  (ZOFRAN -ODT) 4 MG disintegrating tablet Take 1 tablet (4 mg total) by mouth every 8 (eight) hours as needed for nausea or vomiting. 07/07/24   Corlis Burnard DEL, NP  oseltamivir  (TAMIFLU ) 75 MG capsule Take 1 capsule (75 mg total) by mouth every 12 (twelve) hours. 12/26/23   Teresa Shelba SAUNDERS, NP  predniSONE  (DELTASONE ) 50 MG tablet Take 1 tablet/day for the next 4 days. 05/29/24   Cleaster Tinnie LABOR, PA-C  promethazine -dextromethorphan (PROMETHAZINE -DM) 6.25-15 MG/5ML syrup Take 5 mLs by mouth at bedtime as needed. 12/26/23   White, Shelba SAUNDERS, NP  pseudoephedrine  (SUDAFED) 60 MG tablet Take 1 tablet (60 mg total) by mouth  every 8 (eight) hours as needed for congestion. 07/11/21   Christopher Savannah, PA-C  Spacer/Aero-Holding Chambers (E-Z SPACER) inhaler Use as instructed 11/26/18   Koberlein, Junell C, MD    Allergies: Ibuprofen     Review of Systems  Updated Vital Signs BP 112/67   Pulse 93   Temp 97.8 F (36.6 C) (Oral)   Resp 17   Ht 5' 2 (1.575 m)   Wt 104.3 kg   SpO2 100%   BMI 42.07 kg/m   Physical Exam Vitals and nursing note reviewed.  Constitutional:      General: She is not in acute distress.    Appearance: She is well-developed. She is not diaphoretic.  HENT:     Head: Normocephalic and atraumatic.  Eyes:     Conjunctiva/sclera: Conjunctivae normal.  Cardiovascular:     Rate and Rhythm: Normal rate and regular rhythm.     Heart sounds: Normal heart sounds. No murmur heard.    No friction rub. No gallop.  Pulmonary:     Effort: Pulmonary effort is normal. No respiratory distress.     Breath sounds: Normal breath sounds. No wheezing or rales.  Abdominal:     General: There is no distension.     Palpations: Abdomen is soft.     Tenderness: There is abdominal tenderness in the epigastric area. There is no guarding.  Musculoskeletal:        General: No tenderness.     Cervical back: Normal range of motion.  Skin:    General: Skin is warm and dry.     Findings: No erythema or rash.  Neurological:     Mental Status: She is alert and oriented to person, place, and time.     (all labs ordered are listed, but only abnormal results are displayed) Labs Reviewed  COMPREHENSIVE METABOLIC PANEL WITH GFR - Abnormal; Notable for the following components:      Result Value   Potassium 3.4 (*)    All other components within normal limits  URINALYSIS, ROUTINE W REFLEX MICROSCOPIC - Abnormal; Notable for the following components:   APPearance HAZY (*)    Specific Gravity, Urine >1.046 (*)    Ketones, ur 20 (*)    Leukocytes,Ua MODERATE (*)    All other components within normal limits   GASTROINTESTINAL PANEL BY PCR, STOOL (REPLACES STOOL CULTURE)  LIPASE, BLOOD  HCG, SERUM, QUALITATIVE  CBC WITH DIFFERENTIAL/PLATELET    EKG: EKG Interpretation Date/Time:  Friday July 10 2024 17:03:39 EDT Ventricular Rate:  126 PR Interval:  147 QRS Duration:  97 QT Interval:  320 QTC Calculation: 464 R Axis:   68  Text Interpretation: Sinus tachycardia Abnormal Q suggests inferior infarct Since prior ECG< rate has increased Confirmed by Dreama Longs ([id number]) on 07/11/2024 10:58:28 AM  Radiology: CT ABDOMEN PELVIS W CONTRAST Result Date:  07/10/2024 CLINICAL DATA:  Abdominal pain, acute, nonlocalized vomiting and diarrhea since Monday. Pt went to UC on Tuesday and was prescribed Zofran . The pt reports that the nausea has stopped but the diarrhea hasn't. Pt also c/o upper abdominal pain. EXAM: CT ABDOMEN AND PELVIS WITH CONTRAST TECHNIQUE: Multidetector CT imaging of the abdomen and pelvis was performed using the standard protocol following bolus administration of intravenous contrast. RADIATION DOSE REDUCTION: This exam was performed according to the departmental dose-optimization program which includes automated exposure control, adjustment of the mA and/or kV according to patient size and/or use of iterative reconstruction technique. CONTRAST:  OMNIPAQUE  IOHEXOL  300 MG/ML  SOLN COMPARISON:  None Available. FINDINGS: Lower chest: No acute abnormality. Hepatobiliary: The hepatic parenchyma is diffusely hypodense compared to the splenic parenchyma consistent with fatty infiltration. No focal liver abnormality. No gallstones, gallbladder wall thickening, or pericholecystic fluid. No biliary dilatation. Pancreas: No focal lesion. Normal pancreatic contour. No surrounding inflammatory changes. No main pancreatic ductal dilatation. Spleen: Normal in size without focal abnormality. Adrenals/Urinary Tract: No adrenal nodule bilaterally. Bilateral kidneys enhance symmetrically. No  hydronephrosis. No hydroureter. The urinary bladder is unremarkable. Stomach/Bowel: Stomach is within normal limits. No evidence of bowel wall thickening or dilatation. Appendix appears normal. Vascular/Lymphatic: No abdominal aorta or iliac aneurysm. Prominent but nonenlarged mesenteric lymph nodes. No abdominal, pelvic, or inguinal lymphadenopathy. Reproductive: Left ovarian corpus luteum cyst-no follow-up indicated. Uterus and bilateral adnexa are unremarkable. Other: No intraperitoneal free fluid. No intraperitoneal free gas. No organized fluid collection. Musculoskeletal: No abdominal wall hernia or abnormality. No suspicious lytic or blastic osseous lesions. No acute displaced fracture. Multilevel degenerative changes of the spine. IMPRESSION: 1. No acute intra-abdominal or intrapelvic abnormality. 2. Hepatic steatosis. Electronically Signed   By: Morgane  Naveau M.D.   On: 07/10/2024 23:24     Procedures   Medications Ordered in the ED  lactated ringers  bolus 1,000 mL (0 mLs Intravenous Stopped 07/11/24 0028)  iohexol  (OMNIPAQUE ) 300 MG/ML solution 100 mL (100 mLs Intravenous Contrast Given 07/10/24 2305)  potassium chloride  SA (KLOR-CON  M) CR tablet 20 mEq (20 mEq Oral Given 07/10/24 2357)                                     23 year old female with a history of asthma, eczema, presents with concern for abdominal pain, nausea, vomiting and diarrhea.  Her nausea and vomiting have improved, but the diarrhea has continued.  Differential diagnosis includes viral or bacterial gastroenteritis or colitis, inflammatory bowel disease, pancreatitis, cholecystitis, diverticulitis.  Labs completed and personally evaluate interpreted by me show negative pregnancy test with low suspicion for ectopic pregnancy, urinalysis not consistent with urinary tract infection, labs not consistent with pancreatitis or hepatitis.  Mild hypokalemia likely in the setting of diarrhea.  She was given a dose of potassium.   CBC shows no leukocytosis or anemia. Has significant diarrhea, tenderness on exam, doubt epigastric discomfort represents ACS. No recent abx, lower suspicion for C Diff.   EKG shows sinus tachycardia, likely in setting of dehydration from diarrhea, decreased po intake.   CT abdomen pelvis was performed in the setting of abdominal pain to evaluate for signs of appendicitis, diverticulitis, colitis and showed no acute intra-abdominal or intrapelvic abnormality.  Discussed that she does have hepatic steatosis and recommend PCP follow-up.  Discussed most likely symptoms secondary to infectious gastroenteritis, epigastric discomfort from gastritis. She was unable to create a stool sample  in the ED< discussed can continue supportive care, if symptoms go longer than 10 days would recommend oupt stool studies.  Discussed reasons to return to ED. Given rx for zofran , bentyl  and pantoprazole . Patient discharged in stable condition with understanding of reasons to return.       Final diagnoses:  Diarrhea of presumed infectious origin  Nausea and vomiting, unspecified vomiting type  Epigastric abdominal pain    ED Discharge Orders          Ordered    dicyclomine  (BENTYL ) 20 MG tablet  2 times daily        07/11/24 0012    ondansetron  (ZOFRAN -ODT) 4 MG disintegrating tablet  Every 8 hours PRN        07/11/24 0012    pantoprazole  (PROTONIX ) 20 MG tablet  Daily        07/11/24 0012               Dreama Longs, MD 07/11/24 1104

## 2024-07-10 NOTE — ED Triage Notes (Addendum)
 Pt came in for vomiting and diarrhea since Monday. Pt went to UC on Tuesday and was prescribed Zofran . The pt reports that the nausea has stopped but the diarrhea hasn't. Pt also c/o upper abdominal pain.

## 2024-07-11 LAB — URINALYSIS, ROUTINE W REFLEX MICROSCOPIC
Bacteria, UA: NONE SEEN
Bilirubin Urine: NEGATIVE
Glucose, UA: NEGATIVE mg/dL
Hgb urine dipstick: NEGATIVE
Ketones, ur: 20 mg/dL — AB
Nitrite: NEGATIVE
Protein, ur: NEGATIVE mg/dL
Specific Gravity, Urine: >1.046 — ABNORMAL HIGH (ref 1.005–1.030)
pH: 5 (ref 5.0–8.0)

## 2024-07-11 MED ORDER — PANTOPRAZOLE SODIUM 20 MG PO TBEC: 40.0000 mg | DELAYED_RELEASE_TABLET | Freq: Every day | ORAL | 0 refills | Status: AC

## 2024-07-11 MED ORDER — DICYCLOMINE HCL 20 MG PO TABS: 20.0000 mg | ORAL_TABLET | Freq: Two times a day (BID) | ORAL | 0 refills | Status: AC

## 2024-07-11 MED ORDER — ONDANSETRON 4 MG PO TBDP: 4.0000 mg | ORAL_TABLET | Freq: Three times a day (TID) | ORAL | 0 refills | Status: AC | PRN

## 2024-07-17 ENCOUNTER — Encounter: Payer: Self-pay | Admitting: Gastroenterology

## 2024-09-02 ENCOUNTER — Ambulatory Visit: Admitting: Gastroenterology
